# Patient Record
Sex: Male | Born: 1960 | Race: Black or African American | Hispanic: No | Marital: Single | Smoking: Never smoker
Health system: Southern US, Community
[De-identification: ages and names within clinical notes are randomized; demographics above are authoritative.]

## PROBLEM LIST (undated history)

## (undated) DIAGNOSIS — I10 Essential (primary) hypertension: Secondary | ICD-10-CM

---

## 2017-01-18 ENCOUNTER — Observation Stay (HOSPITAL_COMMUNITY)
Admission: EM | Admit: 2017-01-18 | Discharge: 2017-01-19 | Disposition: A | Discharge status: Home / Self Care | Payer: Self-pay | Attending: Internal Medicine | Admitting: Internal Medicine

## 2017-01-18 ENCOUNTER — Encounter (HOSPITAL_COMMUNITY): Payer: Self-pay

## 2017-01-18 ENCOUNTER — Emergency Department (HOSPITAL_COMMUNITY): Payer: Self-pay

## 2017-01-18 ENCOUNTER — Observation Stay (HOSPITAL_COMMUNITY): Payer: Self-pay

## 2017-01-18 DIAGNOSIS — I639 Cerebral infarction, unspecified: Principal | ICD-10-CM | POA: Diagnosis present

## 2017-01-18 DIAGNOSIS — R4781 Slurred speech: Secondary | ICD-10-CM | POA: Insufficient documentation

## 2017-01-18 DIAGNOSIS — I638 Other cerebral infarction: Secondary | ICD-10-CM

## 2017-01-18 DIAGNOSIS — E785 Hyperlipidemia, unspecified: Secondary | ICD-10-CM | POA: Insufficient documentation

## 2017-01-18 DIAGNOSIS — G459 Transient cerebral ischemic attack, unspecified: Secondary | ICD-10-CM

## 2017-01-18 DIAGNOSIS — R2681 Unsteadiness on feet: Secondary | ICD-10-CM | POA: Insufficient documentation

## 2017-01-18 DIAGNOSIS — R29898 Other symptoms and signs involving the musculoskeletal system: Secondary | ICD-10-CM | POA: Insufficient documentation

## 2017-01-18 DIAGNOSIS — I10 Essential (primary) hypertension: Secondary | ICD-10-CM | POA: Diagnosis present

## 2017-01-18 DIAGNOSIS — R29810 Facial weakness: Secondary | ICD-10-CM | POA: Insufficient documentation

## 2017-01-18 DIAGNOSIS — Z6831 Body mass index (BMI) 31.0-31.9, adult: Secondary | ICD-10-CM | POA: Insufficient documentation

## 2017-01-18 DIAGNOSIS — R270 Ataxia, unspecified: Secondary | ICD-10-CM | POA: Insufficient documentation

## 2017-01-18 DIAGNOSIS — F1490 Cocaine use, unspecified, uncomplicated: Secondary | ICD-10-CM | POA: Insufficient documentation

## 2017-01-18 DIAGNOSIS — F1410 Cocaine abuse, uncomplicated: Secondary | ICD-10-CM | POA: Insufficient documentation

## 2017-01-18 DIAGNOSIS — E669 Obesity, unspecified: Secondary | ICD-10-CM | POA: Insufficient documentation

## 2017-01-18 DIAGNOSIS — I6389 Other cerebral infarction: Secondary | ICD-10-CM

## 2017-01-18 DIAGNOSIS — Z7982 Long term (current) use of aspirin: Secondary | ICD-10-CM | POA: Insufficient documentation

## 2017-01-18 HISTORY — DX: Essential (primary) hypertension: I10

## 2017-01-18 LAB — I-STAT CHEM 8, ED
BUN: 12 mg/dL (ref 6–20)
CALCIUM ION: 1.28 mmol/L (ref 1.15–1.40)
CHLORIDE: 103 mmol/L (ref 101–111)
Creatinine, Ser: 1.1 mg/dL (ref 0.61–1.24)
GLUCOSE: 116 mg/dL — AB (ref 65–99)
HCT: 40 % (ref 39.0–52.0)
HEMOGLOBIN: 13.6 g/dL (ref 13.0–17.0)
Potassium: 3.9 mmol/L (ref 3.5–5.1)
Sodium: 140 mmol/L (ref 135–145)
TCO2: 27 mmol/L (ref 0–100)

## 2017-01-18 LAB — URINALYSIS, ROUTINE W REFLEX MICROSCOPIC
BACTERIA UA: NONE SEEN
BILIRUBIN URINE: NEGATIVE
GLUCOSE, UA: NEGATIVE mg/dL
KETONES UR: NEGATIVE mg/dL
LEUKOCYTES UA: NEGATIVE
NITRITE: NEGATIVE
PH: 7 (ref 5.0–8.0)
Protein, ur: NEGATIVE mg/dL
Specific Gravity, Urine: 1.03 (ref 1.005–1.030)
Squamous Epithelial / LPF: NONE SEEN

## 2017-01-18 LAB — COMPREHENSIVE METABOLIC PANEL
ALK PHOS: 50 U/L (ref 38–126)
ALT: 18 U/L (ref 17–63)
ANION GAP: 6 (ref 5–15)
AST: 15 U/L (ref 15–41)
Albumin: 4.2 g/dL (ref 3.5–5.0)
BILIRUBIN TOTAL: 0.8 mg/dL (ref 0.3–1.2)
BUN: 9 mg/dL (ref 6–20)
CALCIUM: 9.7 mg/dL (ref 8.9–10.3)
CO2: 27 mmol/L (ref 22–32)
Chloride: 105 mmol/L (ref 101–111)
Creatinine, Ser: 0.96 mg/dL (ref 0.61–1.24)
Glucose, Bld: 118 mg/dL — ABNORMAL HIGH (ref 65–99)
Potassium: 3.9 mmol/L (ref 3.5–5.1)
Sodium: 138 mmol/L (ref 135–145)
TOTAL PROTEIN: 6.8 g/dL (ref 6.5–8.1)

## 2017-01-18 LAB — I-STAT TROPONIN, ED: TROPONIN I, POC: 0 ng/mL (ref 0.00–0.08)

## 2017-01-18 LAB — DIFFERENTIAL
Basophils Absolute: 0.1 10*3/uL (ref 0.0–0.1)
Basophils Relative: 1 %
EOS PCT: 1 %
Eosinophils Absolute: 0.1 10*3/uL (ref 0.0–0.7)
Lymphocytes Relative: 34 %
Lymphs Abs: 2.4 10*3/uL (ref 0.7–4.0)
MONO ABS: 0.5 10*3/uL (ref 0.1–1.0)
MONOS PCT: 7 %
Neutro Abs: 4.1 10*3/uL (ref 1.7–7.7)
Neutrophils Relative %: 57 %

## 2017-01-18 LAB — CBC
HEMATOCRIT: 38.4 % — AB (ref 39.0–52.0)
Hemoglobin: 13 g/dL (ref 13.0–17.0)
MCH: 30.7 pg (ref 26.0–34.0)
MCHC: 33.9 g/dL (ref 30.0–36.0)
MCV: 90.8 fL (ref 78.0–100.0)
Platelets: 335 10*3/uL (ref 150–400)
RBC: 4.23 MIL/uL (ref 4.22–5.81)
RDW: 14.4 % (ref 11.5–15.5)
WBC: 7.1 10*3/uL (ref 4.0–10.5)

## 2017-01-18 LAB — PROTIME-INR
INR: 1.02
PROTHROMBIN TIME: 13.4 s (ref 11.4–15.2)

## 2017-01-18 LAB — RAPID URINE DRUG SCREEN, HOSP PERFORMED
Amphetamines: NOT DETECTED
Barbiturates: NOT DETECTED
Benzodiazepines: NOT DETECTED
Cocaine: POSITIVE — AB
Opiates: NOT DETECTED
Tetrahydrocannabinol: NOT DETECTED

## 2017-01-18 LAB — APTT: aPTT: 31 seconds (ref 24–36)

## 2017-01-18 MED ORDER — ACETAMINOPHEN 650 MG RE SUPP: 650.0000 mg | RECTAL | Status: DC | PRN

## 2017-01-18 MED ORDER — STROKE: EARLY STAGES OF RECOVERY BOOK
Freq: Once | Status: AC
  Administered 2017-01-18
  Filled 2017-01-18: qty 1

## 2017-01-18 MED ORDER — ASPIRIN 300 MG RE SUPP: 300.0000 mg | Freq: Every day | RECTAL | Status: DC

## 2017-01-18 MED ORDER — SODIUM CHLORIDE 0.9 % IV SOLN
INTRAVENOUS | Status: DC
  Administered 2017-01-18: via INTRAVENOUS

## 2017-01-18 MED ORDER — ASPIRIN 325 MG PO TABS
325.0000 mg | ORAL_TABLET | Freq: Every day | ORAL | Status: DC
  Administered 2017-01-19: 325 mg via ORAL
  Filled 2017-01-18: qty 1

## 2017-01-18 MED ORDER — ACETAMINOPHEN 325 MG PO TABS: 650.0000 mg | ORAL_TABLET | ORAL | Status: DC | PRN

## 2017-01-18 MED ORDER — IOPAMIDOL (ISOVUE-370) INJECTION 76%
INTRAVENOUS | Status: AC
  Administered 2017-01-18: 50 mL
  Filled 2017-01-18: qty 50

## 2017-01-18 MED ORDER — ACETAMINOPHEN 160 MG/5ML PO SOLN: 650.0000 mg | ORAL | Status: DC | PRN

## 2017-01-18 NOTE — ED Provider Notes (Signed)
[facility]-EMERGENCY DEPT Provider Note   CSN: [id number] Arrival date & time: 01/18/17  1713     History   Chief Complaint Chief Complaint  Patient presents with  . Aphasia  . Numbness    HPI Craig Atkinson is a 56 y.o. male.  HPI   Patient is a 56 year old male with history of hypertension who presents the ED with complaint of left-sided facial numbness. Patient reports initially waking up yesterday morning around 8:30 and having mild lightheadedness upon standing up from bed. He states the lightheadedness resolved but notes mid day yesterday he began noticing numbness to the left side of his face near his cheek and jaw. Patient states the numbness has remained constant since yesterday afternoon. He notes yesterday evening when he was talking to a friend on the phone he reported noticing that his speech seemed slightly slurred. Patient states he thinks it was due to laying down while being on the phone and notes his friend reported to speech has mildly improved upon standing. He also reports having mild weakness to his left arm that started yesterday afternoon with onset of numbness. Patient describes weakness as "my left arm just feels lazy". Denies fever, headache, visual changes, dizziness, facial swelling, facial weakness, confusion, altered mental status, shortness of breath, chest pain, palpitations, abdominal pain, vomiting, sig B, seizure. Patient denies history of strokes. Denies use of anticoagulants.   Past Medical History:  Diagnosis Date  . Hypertension     Patient Active Problem List   Diagnosis Date Noted  . CVA (cerebral vascular accident) (HCC) 01/18/2017  . Essential hypertension 01/18/2017    History reviewed. No pertinent surgical history.     Home Medications    Prior to Admission medications   Medication Sig Start Date End Date Taking? Authorizing Provider  lisinopril (PRINIVIL,ZESTRIL) 10 MG tablet Take 10 mg by mouth daily.   Yes Historical Provider, MD     Family History History reviewed. No pertinent family history.  Social History Social History  Substance Use Topics  . Smoking status: Never Smoker  . Smokeless tobacco: Never Used  . Alcohol use Yes     Allergies   Patient has no known allergies.   Review of Systems Review of Systems  Neurological: Positive for numbness.  All other systems reviewed and are negative.    Physical Exam Updated Vital Signs BP (!) 148/79   Pulse 67   Temp 98.1 F (36.7 C)   SpO2 99%   Physical Exam  Constitutional: He is oriented to person, place, and time. He appears well-developed and well-nourished.  HENT:  Head: Normocephalic and atraumatic.  Mouth/Throat: Uvula is midline, oropharynx is clear and moist and mucous membranes are normal. No oropharyngeal exudate, posterior oropharyngeal edema, posterior oropharyngeal erythema or tonsillar abscesses. No tonsillar exudate.  Eyes: Conjunctivae and EOM are normal. Pupils are equal, round, and reactive to light. Right eye exhibits no discharge. Left eye exhibits no discharge. No scleral icterus.  Neck: Normal range of motion. Neck supple.  Cardiovascular: Normal rate, regular rhythm, normal heart sounds and intact distal pulses.   Pulmonary/Chest: Effort normal and breath sounds normal. No respiratory distress. He has no wheezes. He has no rales. He exhibits no tenderness.  Abdominal: Soft. Bowel sounds are normal. He exhibits no distension and no mass. There is no tenderness. There is no rebound and no guarding. No hernia.  Musculoskeletal: Normal range of motion. He exhibits no edema or tenderness.  No midline C, T, or L tenderness. Full  range of motion of neck and back. Full range of motion of bilateral upper and lower extremities, with 5/5 strength. Sensation intact. 2+ radial and PT pulses. Cap refill <2 seconds.   Lymphadenopathy:    He has no cervical adenopathy.  Neurological: He is alert and oriented to person, place, and time. He  has normal strength and normal reflexes. A sensory deficit is present. No cranial nerve deficit. He displays a negative Romberg sign. Coordination and gait normal.  Sensation grossly intact but patient reports decreased sensation to left side of face over maxillary and mandibular region.  Skin: Skin is warm and dry.  Nursing note and vitals reviewed.    ED Treatments / Results  Labs (all labs ordered are listed, but only abnormal results are displayed) Labs Reviewed  CBC - Abnormal; Notable for the following:       Result Value   HCT 38.4 (*)    All other components within normal limits  COMPREHENSIVE METABOLIC PANEL - Abnormal; Notable for the following:    Glucose, Bld 118 (*)    All other components within normal limits  I-STAT CHEM 8, ED - Abnormal; Notable for the following:    Glucose, Bld 116 (*)    All other components within normal limits  PROTIME-INR  APTT  DIFFERENTIAL  I-STAT TROPOININ, ED  CBG MONITORING, ED    EKG  EKG Interpretation None       Radiology Ct Head Wo Contrast  Result Date: 01/18/2017 CLINICAL DATA:  56 year old male with dizziness and slurred speech and numbness on the left side of the face. EXAM: CT HEAD WITHOUT CONTRAST TECHNIQUE: Contiguous axial images were obtained from the base of the skull through the vertex without intravenous contrast. COMPARISON:  None FINDINGS: Brain: There is a 1.0 x 1.2 cm hypoattenuating area in the right thalamus most consistent with an ischemic infarct, likely acute or early subacute. Correlation with clinical exam and further evaluation with MRI recommended. There is no acute intracranial hemorrhage. No mass effect or midline shift noted. The ventricles and sulci are appropriate in size for patient's age. Vascular: No hyperdense vessel or unexpected calcification. Skull: Normal. Negative for fracture or focal lesion. Sinuses/Orbits: The visualized paranasal sinuses are clear. There is partial opacification of the  left mastoid air cells. The right mastoid air cells are clear. Other: None IMPRESSION: 1. Right thalamic ischemic infarct, likely acute or subacute. Correlation with clinical exam and further evaluation with MRI recommended. 2. No acute intracranial hemorrhage. These results were called by telephone at the time of interpretation on 01/18/2017 at 6:47 pm to Dr. Jacqulyn Bath, who verbally acknowledged these results. Electronically Signed   By: Elgie Collard M.D.   On: 01/18/2017 19:15    Procedures Procedures (including critical care time)  Medications Ordered in ED Medications - No data to display   Initial Impression / Assessment and Plan / ED Course  I have reviewed the triage vital signs and the nursing notes.  Pertinent labs & imaging results that were available during my care of the patient were reviewed by me and considered in my medical decision making (see chart for details).     Patient presents with left-sided facial numbness that started yesterday afternoon with associated mild left arm weakness. Symptoms have remained constant today. History of hypertension. VSS. Exam revealed decreased sensation to maxillary and mandibular region of left face, remaining neuro exam unremarkable. CT head ordered in triage revealed right thalamic ischemic infarct. Discussed results and plan for admission with  pt. Consulted neurology, Dr. Amada JupiterKirkpatrick reports he will come evaluate pt in the ED, plan to admit to medicine. Consulted hospitalist. Dr. Adela Glimpseoutova agrees to admission.  Final Clinical Impressions(s) / ED Diagnoses   Final diagnoses:  Cerebrovascular accident (CVA) due to other mechanism [facility](HCC)    New Prescriptions New Prescriptions   No medications on file     Barrett Henleicole Elizabeth Alexsis Branscom, Cordelia Poche-C 01/18/17 2117    Marily MemosJason Mesner, MD 01/18/17 2351

## 2017-01-18 NOTE — H&P (Signed)
Craig Atkinson ZOX:[id number] DOB: October 21, 1961 DOA: 01/18/2017     PCP: No primary care provider on file.   Outpatient Specialists: none   Patient coming from:  home Lives alone,       Chief Complaint: L facial numbness slurred speech  HPI: Craig Atkinson is a 56 y.o. male with medical history significant of HTN    Presented with    weakness of the left side of his face since yesterday associated with some dizziness and some headache,  Yesterday started at 8:30 AM  Associated with lightheadness worse with standing. His numbness have persistent since yesterday. He was talking to friend and noted slurred speech. Reports sometimes he forgets his medication, yesterday had some weakness in left leg but now better only jaw numbness have persisted. He was able to ambulate.   NO fever, chills, no chest pain Regarding pertinent Chronic problems: hypertension   IN ER:  Temp (24hrs), Avg:98.1 F (36.7 C), Min:98.1 F (36.7 C), Max:98.1 F (36.7 C)  CT showing acute  right thalamic infarct Following Medications were ordered in ER: Medications - No data to display   ER provider discussed case with: Neurology , left facial numbness  Hospitalist was called for admission for CVA  Review of Systems:    Pertinent positives include: slurred speech  Constitutional:  No weight loss, night sweats, Fevers, chills, fatigue, weight loss  HEENT:  No headaches, Difficulty swallowing,Tooth/dental problems,Sore throat,  No sneezing, itching, ear ache, nasal congestion, post nasal drip,  Cardio-vascular:  No chest pain, Orthopnea, PND, anasarca, dizziness, palpitations.no Bilateral lower extremity swelling  GI:  No heartburn, indigestion, abdominal pain, nausea, vomiting, diarrhea, change in bowel habits, loss of appetite, melena, blood in stool, hematemesis Resp:  no shortness of breath at rest. No dyspnea on exertion, No excess mucus, no productive cough, No non-productive cough, No  coughing up of blood.No change in color of mucus.No wheezing. Skin:  no rash or lesions. No jaundice GU:  no dysuria, change in color of urine, no urgency or frequency. No straining to urinate.  No flank pain.  Musculoskeletal:  No joint pain or no joint swelling. No decreased range of motion. No back pain.  Psych:  No change in mood or affect. No depression or anxiety. No memory loss.  Neuro: no localizing neurological complaints,   no weakness, no double vision, no gait abnormality,    As per HPI otherwise 10 point review of systems negative.   Past Medical History: Past Medical History:  Diagnosis Date  . Hypertension    History reviewed. No pertinent surgical history.   Social History:  Ambulatory  Independently     reports that he has never smoked. He has never used smokeless tobacco. He reports that he drinks alcohol. He reports that he uses drugs, including Cocaine.  Allergies:  No Known Allergies     Family History:   Family History  Problem Relation Age of Onset  . Diabetes Mother   . Hypertension Mother   . Diabetes Father   . Hypertension Father   . Melanoma Father   . Stroke Neg Hx     Medications: Prior to Admission medications   Medication Sig Start Date End Date Taking? Authorizing Provider  lisinopril (PRINIVIL,ZESTRIL) 10 MG tablet Take 10 mg by mouth daily.   Yes Historical Provider, MD    Physical Exam: Patient Vitals for the past 24 hrs:  BP Temp Pulse SpO2  01/18/17 1935 (!) 148/79 - 67 99 %  01/18/17 1722 (!) 144/77 98.1 F (36.7 C) 70 100 %    1. General:  in No Acute distress 2. Psychological: Alert and  Oriented 3. Head/ENT:     Dry Mucous Membranes                          Head Non traumatic, neck supple                          Normal   Dentition 4. SKIN: normal  Skin turgor,  Skin clean Dry and intact no rash 5. Heart: Regular rate and rhythm no  Murmur, Rub or gallop 6. Lungs:  no wheezes or crackles   7. Abdomen: Soft,  non-tender, Non distended 8. Lower extremities: no clubbing, cyanosis, or edema 9. Neurologically   strength 5 out of 5 in all 4 extremities cranial nerves left facial droop 10. MSK: Normal range of motion   body mass index is unknown because there is no height or weight on file.  Labs on Admission:   Labs on Admission: I have personally reviewed following labs and imaging studies  CBC:  Recent Labs Lab 01/18/17 1725 01/18/17 1747  WBC 7.1  --   NEUTROABS 4.1  --   HGB 13.0 13.6  HCT 38.4* 40.0  MCV 90.8  --   PLT 335  --    Basic Metabolic Panel:  Recent Labs Lab 01/18/17 1725 01/18/17 1747  NA 138 140  K 3.9 3.9  CL 105 103  CO2 27  --   GLUCOSE 118* 116*  BUN 9 12  CREATININE 0.96 1.10  CALCIUM 9.7  --    GFR: CrCl cannot be calculated (Unknown ideal weight.). Liver Function Tests:  Recent Labs Lab 01/18/17 1725  AST 15  ALT 18  ALKPHOS 50  BILITOT 0.8  PROT 6.8  ALBUMIN 4.2   No results for input(s): LIPASE, AMYLASE in the last 168 hours. No results for input(s): AMMONIA in the last 168 hours. Coagulation Profile:  Recent Labs Lab 01/18/17 1725  INR 1.02   Cardiac Enzymes: No results for input(s): CKTOTAL, CKMB, CKMBINDEX, TROPONINI in the last 168 hours. BNP (last 3 results) No results for input(s): PROBNP in the last 8760 hours. HbA1C: No results for input(s): HGBA1C in the last 72 hours. CBG: No results for input(s): GLUCAP in the last 168 hours. Lipid Profile: No results for input(s): CHOL, HDL, LDLCALC, TRIG, CHOLHDL, LDLDIRECT in the last 72 hours. Thyroid Function Tests: No results for input(s): TSH, T4TOTAL, FREET4, T3FREE, THYROIDAB in the last 72 hours. Anemia Panel: No results for input(s): VITAMINB12, FOLATE, FERRITIN, TIBC, IRON, RETICCTPCT in the last 72 hours. Urine analysis: No results found for: COLORURINE, APPEARANCEUR, LABSPEC, PHURINE, GLUCOSEU, HGBUR, BILIRUBINUR, KETONESUR, PROTEINUR, UROBILINOGEN, NITRITE,  LEUKOCYTESUR Sepsis Labs: @LABRCNTIP (procalcitonin:4,lacticidven:4) )No results found for this or any previous visit (from the past 240 hour(s)).     UA ordered  No results found for: HGBA1C  CrCl cannot be calculated (Unknown ideal weight.).  BNP (last 3 results) No results for input(s): PROBNP in the last 8760 hours.   ECG REPORT  Independently reviewed Rate: 69  Rhythm: NSR ST&T Change: No acute ischemic changes   QTC 409  There were no vitals filed for this visit.   Cultures: No results found for: SDES, SPECREQUEST, CULT, REPTSTATUS   Radiological Exams on Admission: Ct Head Wo Contrast  Result Date: 01/18/2017 CLINICAL DATA:  [age] year old  male with dizziness and slurred speech and numbness on the left side of the face. EXAM: CT HEAD WITHOUT CONTRAST TECHNIQUE: Contiguous axial images were obtained from the base of the skull through the vertex without intravenous contrast. COMPARISON:  None FINDINGS: Brain: There is a 1.0 x 1.2 cm hypoattenuating area in the right thalamus most consistent with an ischemic infarct, likely acute or early subacute. Correlation with clinical exam and further evaluation with MRI recommended. There is no acute intracranial hemorrhage. No mass effect or midline shift noted. The ventricles and sulci are appropriate in size for patient's age. Vascular: No hyperdense vessel or unexpected calcification. Skull: Normal. Negative for fracture or focal lesion. Sinuses/Orbits: The visualized paranasal sinuses are clear. There is partial opacification of the left mastoid air cells. The right mastoid air cells are clear. Other: None IMPRESSION: 1. Right thalamic ischemic infarct, likely acute or subacute. Correlation with clinical exam and further evaluation with MRI recommended. 2. No acute intracranial hemorrhage. These results were called by telephone at the time of interpretation on 01/18/2017 at 6:47 pm to Dr. Jacqulyn Bath, who verbally acknowledged these results.  Electronically Signed   By: Elgie Collard M.D.   On: 01/18/2017 19:15    Chart has been reviewed    Assessment/Plan  56 y.o. male with medical history significant of HTN admitted for CVA   Present on Admission: . CVA (cerebral vascular accident) (HCC) -  - will admit based on TIA/CVA protocol, await results of  Carotid Doppler and Echo, obtain cardiac enzymes,  ECG,   Lipid panel, TSH. Order PT/OT evaluation. Will make sure patient is on antiplatelet agent.   Neurology consulted.      . Essential hypertension hold home medications for tonight to allow some permissive hypertension   Other plan as per orders.  DVT prophylaxis:  SCD    Code Status:  FULL CODE  as per patient    Family Communication:   Family not  at  Bedside    Disposition Plan:     To home once workup is complete and patient is stable                     Would benefit from PT/OT eval prior to DC  ordered                              Consults called: Neurology aware    Admission status:    obs    Level of care    tele            I have spent a total of 56 min on this admission   extra time was spent to discuss case with neurology  Tyrie Porzio 01/18/2017, 11:40 PM    [facility]  Pager [phone]   after 2 AM please page floor coverage PA If 7AM-7PM, please contact the day team taking care of the patient  [email or web address]  Password [email or web address]

## 2017-01-18 NOTE — ED Provider Notes (Signed)
Called by radiology regarding CT head ordered for patient still in the waiting room. There is a acute vs early sub acute right thalamic infarct on CT. Will work to get patient back to an acute bed ASAP.   Alona BeneJoshua Candela Krul, MD   Maia PlanJoshua G Jyllian Haynie, MD 01/18/17 [phone]

## 2017-01-18 NOTE — ED Notes (Signed)
Report called to rn on 5c rm 13

## 2017-01-18 NOTE — ED Notes (Signed)
The pt is c/o dizziness and numbness on the lt side of his face   Since yesterday  No pain no speech problem

## 2017-01-18 NOTE — Progress Notes (Signed)
Pt admitted from ED with stroke diagnosis, alert and oriented, denies any pain, pt settled in bed with call light at bedside, tele monitor put and verified on pt, pt reassured and will continue to monitor, v/s stable. Obasogie-Asidi, Rocko Fesperman Efe

## 2017-01-18 NOTE — Consult Note (Signed)
Neurology Consultation Reason for Consult: Left facial numbness Referring Physician: Mesner, J  CC: Left facial numbness  History is obtained from:patient  HPI: Craig Atkinson is a 56 y.o. male who awoke the morning of 3/15 with numbness on the left side of his face and mild difficult walking. He states that he also noticed some mild slurred speech when talking to a friend. This persisted throughout the day, and was still ongoing today and therefore he sought care in the emergency department this afternoon. Here, a CT scan was performed which demonstrates right thalamic infarct. He last used cocaine 5 days ago.   LKW: 3/14, prior to bed tpa given?: no, out of window   ROS: A 14 point ROS was performed and is negative except as noted in the HPI.   Past Medical History:  Diagnosis Date  . Hypertension      Family history: No history of similar   Social History:  reports that he has never smoked. He has never used smokeless tobacco. He reports that he drinks alcohol. He reports that he uses drugs, including Cocaine.   Exam: Current vital signs: BP (!) 148/79   Pulse 67   Temp 98.1 F (36.7 C)   SpO2 99%  Vital signs in last 24 hours: Temp:  [98.1 F (36.7 C)] 98.1 F (36.7 C) (03/16 1722) Pulse Rate:  [67-70] 67 (03/16 1935) BP: (144-148)/(77-79) 148/79 (03/16 1935) SpO2:  [99 %-100 %] 99 % (03/16 1935)   Physical Exam  Constitutional: Appears well-developed and well-nourished.  Psych: Affect appropriate to situation Eyes: No scleral injection HENT: No OP obstrucion Head: Normocephalic.  Cardiovascular: Normal rate and regular rhythm.  Respiratory: Effort normal and breath sounds normal to anterior ascultation GI: Soft.  No distension. There is no tenderness.  Skin: WDI  Neuro: Mental Status: Patient is awake, alert, oriented to person, place, month, year, and situation. Patient is able to give a clear and coherent history. No signs of aphasia or  neglect Cranial Nerves: II: Visual Fields are full. Pupils are equal, round, and reactive to light.   III,IV, VI: EOMI without ptosis or diploplia.  V: Facial sensation is symmetric to temperature VII: Facial movement is symmetric.  VIII: hearing is intact to voice X: Uvula elevates symmetrically XI: Shoulder shrug is symmetric. XII: tongue is midline without atrophy or fasciculations.  Motor: Tone is normal. Bulk is normal. 5/5 strength was present in all four extremities.  Sensory: Sensation is symmetric to light touch and temperature in the arms and legs. Deep Tendon Reflexes: 2+ and symmetric in the biceps and 3+ in patellae.  Plantars: Toes are downgoing bilaterally.  Cerebellar: He has difficulty with finger-nose-finger and heel-knee-shin on the left compared to the right.   I have reviewed labs in epic and the results pertinent to this consultation are: CMP-unremarkable  I have reviewed the images obtained: CT head-right thalamic stroke  Impression: 56 year old male with likely small vessel infarction. Certainly cocaine could have contributed. He'll need admission and workup for other risk factors that are modifiable. I have counseled him to stop cocaine use.  Recommendations: 1. HgbA1c, fasting lipid panel 2. MRI of the brain without contrast 3. Frequent neuro checks 4. Echocardiogram 5. CTA head and neck 6. Prophylactic therapy-Antiplatelet med: Aspirin - dose 325mg  PO or 300mg  PR 7. Risk factor modification 8. Telemetry monitoring 9. PT consult, OT consult, Speech consult 10. please page stroke NP  Or  PA  Or MD  from 8am -4 pm starting  3/17 as this patient will be followed by the stroke team at this point.   You can look them up on [email or web address]      Ritta Slot, MD [facility] [phone]  If 7pm- 7am, please page neurology on call as listed in AMION.

## 2017-01-18 NOTE — ED Notes (Signed)
Dr. Clayborne DanaMesner requested pt be fed.

## 2017-01-18 NOTE — ED Notes (Signed)
The pt was not npo prior to the swallow screen   Dr Clayborne Danamesner edp gave the pt permission to eat before the swallow screen was performed.  He ate a [place] sandwich  Given to him by nt

## 2017-01-18 NOTE — ED Notes (Signed)
Equal grips moves all fours   Speech clear

## 2017-01-18 NOTE — ED Notes (Signed)
Ed pa gave the pt a nih of 1 for los of sensation lt face

## 2017-01-18 NOTE — ED Notes (Signed)
Pt returned from ct

## 2017-01-18 NOTE — ED Notes (Signed)
Per ed pa

## 2017-01-18 NOTE — ED Triage Notes (Signed)
Pt complaining of L sided facial numbness and slurred speech. Pt states LSN yesterday. Pt a/o x 4 at triage, no other obvious neuro deficits. Pt denies any headache, states some dizziness yesterday.

## 2017-01-19 ENCOUNTER — Observation Stay (HOSPITAL_COMMUNITY): Payer: Self-pay

## 2017-01-19 ENCOUNTER — Observation Stay (HOSPITAL_BASED_OUTPATIENT_CLINIC_OR_DEPARTMENT_OTHER): Payer: Self-pay

## 2017-01-19 DIAGNOSIS — I6789 Other cerebrovascular disease: Secondary | ICD-10-CM

## 2017-01-19 DIAGNOSIS — F1410 Cocaine abuse, uncomplicated: Secondary | ICD-10-CM

## 2017-01-19 LAB — ECHOCARDIOGRAM COMPLETE
HEIGHTINCHES: 62 in
Weight: 2761.6 oz

## 2017-01-19 LAB — LIPID PANEL
CHOL/HDL RATIO: 5.4 ratio
Cholesterol: 212 mg/dL — ABNORMAL HIGH (ref 0–200)
HDL: 39 mg/dL — ABNORMAL LOW (ref >40)
LDL CALC: 144 mg/dL — AB (ref 0–99)
TRIGLYCERIDES: 147 mg/dL (ref <150)
VLDL: 29 mg/dL (ref 0–40)

## 2017-01-19 MED ORDER — NONFORMULARY OR COMPOUNDED ITEM: 0 refills | Status: AC

## 2017-01-19 MED ORDER — ATORVASTATIN CALCIUM 40 MG PO TABS: 40.0000 mg | ORAL_TABLET | Freq: Every day | ORAL | Status: DC

## 2017-01-19 MED ORDER — ATORVASTATIN CALCIUM 40 MG PO TABS: 40.0000 mg | ORAL_TABLET | Freq: Every day | ORAL | 0 refills | Status: DC

## 2017-01-19 MED ORDER — ASPIRIN 325 MG PO TABS: 325.0000 mg | ORAL_TABLET | Freq: Every day | ORAL | 0 refills | Status: DC

## 2017-01-19 NOTE — Discharge Summary (Signed)
PATIENT DETAILS Name: Craig Atkinson Age: 56 y.o. Sex: male Date of Birth: 05-13-1961 MRN: [id number]. Admitting Physician: Therisa Doyne, MD PCP:No PCP Per Patient  Admit Date: 01/18/2017 Discharge date: 01/19/2017  Recommendations for Outpatient Follow-up:  1. Follow up with PCP in 1-2 weeks 2. Please obtain BMP/CBC in one week 3. Follow A1C and HIV-pending at the time of discharge.   Admitted From:  Home  Disposition: Home  Home Health: No  Equipment/Devices: None  Discharge Condition: Stable  CODE STATUS: FULL CODE  Diet recommendation:  Heart Healthy   Brief Summary: See H&P, Labs, Consult and Test reports for all details in brief, Patient is a 56 y.o. male with past medical history of hypertension, cocaine use-presented with left facial numbness, slight slurred speech and left upper extremity weakness-CT scan of the head demonstrated a right thalamic infarct. His last cocaine use was approximately 5 days back. 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 Very minimal left upper extremity weakness.MRI brain confirmed a 1.4 cm acute infarction int he right thalamus. Echo showed EF 55% with no embolic source. .CTA neck without any major stenosis. Telemetry-negative for arrhythmias. LDL 144. Patient now on ASA and Lipitor. Spoke with Stroke MD-Dr Desmond Dike further recommendation-ok to discharge today.As noted above-A1C pending, will need to be followed by PCP.  Hypertension: BP stable-resume anti-hypertensive on discharge.   Cocaine abuse: Counseled extensively-he is aware of the life threatening and life disabling risk associated with cocaine use. Note at patient's request (see prior note) this was NOT discussed with his family.  Procedures/Studies: Echo 3/17>> - Left ventricle: The cavity size was normal. There was mild focal   basal hypertrophy of the septum. Systolic  function was normal.   The estimated ejection fraction was in the range of 55% to 60%.   Wall motion was normal; there were no regional wall motion   abnormalities. Left ventricular diastolic function parameters   were normal. - Mitral valve: Focal calcification. There was trivial   regurgitation. - Left atrium: The atrium was mildly dilated. - Pulmonic valve: There was trivial regurgitation.   Discharge Diagnoses:  Active Problems:   CVA (cerebral vascular accident) [facility])   Essential hypertension   Discharge Instructions:  Activity:  As tolerated    Discharge Instructions    Ambulatory referral to Neurology    Complete by:  As directed    Call MD for:  hives    Complete by:  As directed    Call MD for:  persistant dizziness or light-headedness    Complete by:  As directed    Call MD for:  redness, tenderness, or signs of infection (pain, swelling, redness, odor or green/yellow discharge around incision site)    Complete by:  As directed    Diet - low sodium heart healthy    Complete by:  As directed    Discharge instructions    Complete by:  As directed    Follow with Primary MD in 1 week  Your A1C and HIV test are pending at the time of discharge, please ask your primary MD to follow these test results  Please get a complete blood count and chemistry panel checked by your Primary MD at your next visit, and again as instructed by your Primary MD.  Get Medicines reviewed and adjusted: Please take all your medications with you for your next visit with your Primary MD  Laboratory/radiological data: Please request your  Primary MD to go over all hospital tests and procedure/radiological results at the follow up, please ask your Primary MD to get all Hospital records sent to his/her office.  In some cases, they will be blood work, cultures and biopsy results pending at the time of your discharge. Please request that your primary care M.D. follows up on these  results.  Also Note the following: If you experience worsening of your admission symptoms, develop shortness of breath, life threatening emergency, suicidal or homicidal thoughts you must seek medical attention immediately by calling 911 or calling your MD immediately  if symptoms less severe.  You must read complete instructions/literature along with all the possible adverse reactions/side effects for all the Medicines you take and that have been prescribed to you. Take any new Medicines after you have completely understood and accpet all the possible adverse reactions/side effects.   Do not drive when taking Pain medications or sleeping medications (Benzodaizepines)  Do not take more than prescribed Pain, Sleep and Anxiety Medications. It is not advisable to combine anxiety,sleep and pain medications without talking with your primary care practitioner  Special Instructions: If you have smoked or chewed Tobacco  in the last 2 yrs please stop smoking, stop any regular Alcohol  and or any Recreational drug use.  Wear Seat belts while driving.  Please note: You were cared for by a hospitalist during your hospital stay. Once you are discharged, your primary care physician will handle any further medical issues. Please note that NO REFILLS for any discharge medications will be authorized once you are discharged, as it is imperative that you return to your primary care physician (or establish a relationship with a primary care physician if you do not have one) for your post hospital discharge needs so that they can reassess your need for medications and monitor your lab values.   Increase activity slowly    Complete by:  As directed      Allergies as of 01/19/2017   No Known Allergies     Medication List    TAKE these medications   aspirin 325 MG tablet Take 1 tablet (325 mg total) by mouth daily. Start taking on:  01/20/2017   atorvastatin 40 MG tablet Commonly known as:  LIPITOR Take 1  tablet (40 mg total) by mouth daily at 6 PM.   lisinopril 10 MG tablet Commonly known as:  PRINIVIL,ZESTRIL Take 10 mg by mouth daily.   NONFORMULARY OR COMPOUNDED ITEM Please evaluate and treat for outpatient OT Diagnoses: Acute CVA      Follow-up Information    [facility]. Schedule an appointment as soon as possible for a visit in 1 week(s).   Why:  please call on 3/19-and make a appointment. Please let them know that you were admitted to the hospital. Contact information: [address] [place] [address] [phone]         No Known Allergies  Consultations:   neurology  Other Procedures/Studies: Ct Angio Head W Or Wo Contrast  Result Date: 01/18/2017 CLINICAL DATA:  Left-sided facial numbness EXAM: CT ANGIOGRAPHY HEAD AND NECK TECHNIQUE: Multidetector CT imaging of the head and neck was performed using the standard protocol during bolus administration of intravenous contrast. Multiplanar CT image reconstructions and MIPs were obtained to evaluate the vascular anatomy. Carotid stenosis measurements (when applicable) are obtained utilizing NASCET criteria, using the distal internal carotid diameter as the denominator. CONTRAST:  50 mL Isovue 370 COMPARISON:  Head  CT 01/18/2017 FINDINGS: CTA NECK FINDINGS Aortic arch: There is no aneurysm or dissection of the visualized ascending aorta or aortic arch. There is a normal 3 vessel branching pattern. The visualized proximal subclavian arteries are normal. Right carotid system: There is mixed calcified and noncalcified atherosclerotic plaque within the proximal right internal carotid artery without hemodynamically significant stenosis by NASCET criteria. The remainder of the right ICA is normal. Left carotid system: There is mixed atherosclerotic calcified and noncalcified plaque within the proximal left internal carotid artery without hemodynamically significant stenosis. Otherwise  normal. Vertebral arteries: The vertebral system is left dominant. Both vertebral artery origins are normal. Both vertebral arteries are normal to their confluence with the basilar artery. Skeleton: There is no bony spinal canal stenosis. No lytic or blastic lesions. Other neck: The nasopharynx is clear. The oropharynx and hypopharynx are normal. The epiglottis is normal. The supraglottic larynx, glottis and subglottic larynx are normal. No retropharyngeal collection. The parapharyngeal spaces are preserved. The parotid and submandibular glands are normal. No sialolithiasis or salivary ductal dilatation. The thyroid gland is normal. There is no cervical lymphadenopathy. Upper chest: No pneumothorax or pleural effusion. No nodules or masses. Review of the MIP images confirms the above findings CTA HEAD FINDINGS Anterior circulation: --Intracranial internal carotid arteries: There is atherosclerotic calcification of both clinoid segments with a small amount of noncalcified plaque within the right distal clinoid/proximal communicating segment. --Anterior cerebral arteries: Normal. --Middle cerebral arteries: Normal. --Posterior communicating arteries: There is a small P-comm on the left. Posterior circulation: --Posterior cerebral arteries: Normal. --Superior cerebellar arteries: Normal. --Basilar artery: Normal. --Anterior inferior cerebellar arteries: Normal. --Posterior inferior cerebellar arteries: Normal. Venous sinuses: As permitted by contrast timing, patent. Anatomic variants: None Delayed phase: No parenchymal contrast enhancement. Review of the MIP images confirms the above findings IMPRESSION: 1. No emergent large vessel occlusion. 2. Bilateral mixed calcified and noncalcified atherosclerotic plaque within the proximal internal carotid arteries without hemodynamically significant stenosis by NASCET criteria. 3. Bilateral calcific atherosclerosis of the supraclinoid internal carotid arteries with a small  amount of noncalcified plaque in the communicating segment of the right ICA. Electronically Signed   By: Deatra Robinson M.D.   On: 01/18/2017 22:58   Ct Head Wo Contrast  Result Date: 01/18/2017 CLINICAL DATA:  56 year old male with dizziness and slurred speech and numbness on the left side of the face. EXAM: CT HEAD WITHOUT CONTRAST TECHNIQUE: Contiguous axial images were obtained from the base of the skull through the vertex without intravenous contrast. COMPARISON:  None FINDINGS: Brain: There is a 1.0 x 1.2 cm hypoattenuating area in the right thalamus most consistent with an ischemic infarct, likely acute or early subacute. Correlation with clinical exam and further evaluation with MRI recommended. There is no acute intracranial hemorrhage. No mass effect or midline shift noted. The ventricles and sulci are appropriate in size for patient's age. Vascular: No hyperdense vessel or unexpected calcification. Skull: Normal. Negative for fracture or focal lesion. Sinuses/Orbits: The visualized paranasal sinuses are clear. There is partial opacification of the left mastoid air cells. The right mastoid air cells are clear. Other: None IMPRESSION: 1. Right thalamic ischemic infarct, likely acute or subacute. Correlation with clinical exam and further evaluation with MRI recommended. 2. No acute intracranial hemorrhage. These results were called by telephone at the time of interpretation on 01/18/2017 at 6:47 pm to Dr. Jacqulyn Bath, who verbally acknowledged these results. Electronically Signed   By: Elgie Collard M.D.   On: 01/18/2017 19:15   Ct Angio  Neck W Or Wo Contrast  Result Date: 01/18/2017 CLINICAL DATA:  Left-sided facial numbness EXAM: CT ANGIOGRAPHY HEAD AND NECK TECHNIQUE: Multidetector CT imaging of the head and neck was performed using the standard protocol during bolus administration of intravenous contrast. Multiplanar CT image reconstructions and MIPs were obtained to evaluate the vascular anatomy.  Carotid stenosis measurements (when applicable) are obtained utilizing NASCET criteria, using the distal internal carotid diameter as the denominator. CONTRAST:  50 mL Isovue 370 COMPARISON:  Head CT 01/18/2017 FINDINGS: CTA NECK FINDINGS Aortic arch: There is no aneurysm or dissection of the visualized ascending aorta or aortic arch. There is a normal 3 vessel branching pattern. The visualized proximal subclavian arteries are normal. Right carotid system: There is mixed calcified and noncalcified atherosclerotic plaque within the proximal right internal carotid artery without hemodynamically significant stenosis by NASCET criteria. The remainder of the right ICA is normal. Left carotid system: There is mixed atherosclerotic calcified and noncalcified plaque within the proximal left internal carotid artery without hemodynamically significant stenosis. Otherwise normal. Vertebral arteries: The vertebral system is left dominant. Both vertebral artery origins are normal. Both vertebral arteries are normal to their confluence with the basilar artery. Skeleton: There is no bony spinal canal stenosis. No lytic or blastic lesions. Other neck: The nasopharynx is clear. The oropharynx and hypopharynx are normal. The epiglottis is normal. The supraglottic larynx, glottis and subglottic larynx are normal. No retropharyngeal collection. The parapharyngeal spaces are preserved. The parotid and submandibular glands are normal. No sialolithiasis or salivary ductal dilatation. The thyroid gland is normal. There is no cervical lymphadenopathy. Upper chest: No pneumothorax or pleural effusion. No nodules or masses. Review of the MIP images confirms the above findings CTA HEAD FINDINGS Anterior circulation: --Intracranial internal carotid arteries: There is atherosclerotic calcification of both clinoid segments with a small amount of noncalcified plaque within the right distal clinoid/proximal communicating segment. --Anterior  cerebral arteries: Normal. --Middle cerebral arteries: Normal. --Posterior communicating arteries: There is a small P-comm on the left. Posterior circulation: --Posterior cerebral arteries: Normal. --Superior cerebellar arteries: Normal. --Basilar artery: Normal. --Anterior inferior cerebellar arteries: Normal. --Posterior inferior cerebellar arteries: Normal. Venous sinuses: As permitted by contrast timing, patent. Anatomic variants: None Delayed phase: No parenchymal contrast enhancement. Review of the MIP images confirms the above findings IMPRESSION: 1. No emergent large vessel occlusion. 2. Bilateral mixed calcified and noncalcified atherosclerotic plaque within the proximal internal carotid arteries without hemodynamically significant stenosis by NASCET criteria. 3. Bilateral calcific atherosclerosis of the supraclinoid internal carotid arteries with a small amount of noncalcified plaque in the communicating segment of the right ICA. Electronically Signed   By: Deatra Robinson M.D.   On: 01/18/2017 22:58   Mr Brain Wo Contrast  Result Date: 01/19/2017 CLINICAL DATA:  Hypertension. Left facial weakness beginning yesterday. Dizziness and headache. EXAM: MRI HEAD WITHOUT CONTRAST TECHNIQUE: Multiplanar, multiecho pulse sequences of the brain and surrounding structures were obtained without intravenous contrast. COMPARISON:  CT studies 01/18/2017 FINDINGS: Brain: Diffusion imaging shows a 1.4 cm acute infarction of the right thalamus. No other acute infarction. The brainstem and cerebellum are normal. Cerebral hemispheres are otherwise normal. No evidence of prior infarction. No mass lesion, hemorrhage, hydrocephalus or extra-axial collection. Vascular: Major vessels at the base of the brain show flow. Right vertebral artery is a tiny vessel shown to be patent by CT angiography. Skull and upper cervical spine: Negative Sinuses/Orbits: Sinuses are clear. Orbits are normal. Mastoid effusion on the left. Right  mastoid is clear. Other: None  IMPRESSION: 1.4 cm acute infarction affecting the right thalamus. No evidence of hemorrhage or mass effect. Brain otherwise appears normal. Diminutive right vertebral artery shown to be patent by CT angiography. Left mastoid effusion Electronically Signed   By: Paulina Fusi M.D.   On: 01/19/2017 13:45    TODAY-DAY OF DISCHARGE:  Subjective:   Craig Atkinson today has no headache,no chest abdominal pain,no new weakness tingling or numbness, feels much better wants to go home today.   Objective:   Blood pressure (!) 151/87, pulse 67, temperature 99 F (37.2 C), temperature source Oral, resp. rate 20, height 5\' 2"  (1.575 m), weight 78.3 kg (172 lb 9.6 oz), SpO2 99 %.  Intake/Output Summary (Last 24 hours) at 01/19/17 1439 Last data filed at 01/19/17 1226  Gross per 24 hour  Intake          1178.75 ml  Output                0 ml  Net          1178.75 ml   Filed Weights   01/19/17 0000  Weight: 78.3 kg (172 lb 9.6 oz)    Exam: Awake Alert, Oriented *3, No new F.N deficits, Normal affect [place].AT,PERRAL Supple Neck,No JVD, No cervical lymphadenopathy appriciated.  Symmetrical Chest wall movement, Good air movement bilaterally, CTAB RRR,No Gallops,Rubs or new Murmurs, No Parasternal Heave +ve B.Sounds, Abd Soft, Non tender, No organomegaly appriciated, No rebound -guarding or rigidity. No Cyanosis, Clubbing or edema, No new Rash or bruise   PERTINENT RADIOLOGIC STUDIES: Ct Angio Head W Or Wo Contrast  Result Date: 01/18/2017 CLINICAL DATA:  Left-sided facial numbness EXAM: CT ANGIOGRAPHY HEAD AND NECK TECHNIQUE: Multidetector CT imaging of the head and neck was performed using the standard protocol during bolus administration of intravenous contrast. Multiplanar CT image reconstructions and MIPs were obtained to evaluate the vascular anatomy. Carotid stenosis measurements (when applicable) are obtained utilizing NASCET criteria, using the distal internal  carotid diameter as the denominator. CONTRAST:  50 mL Isovue 370 COMPARISON:  Head CT 01/18/2017 FINDINGS: CTA NECK FINDINGS Aortic arch: There is no aneurysm or dissection of the visualized ascending aorta or aortic arch. There is a normal 3 vessel branching pattern. The visualized proximal subclavian arteries are normal. Right carotid system: There is mixed calcified and noncalcified atherosclerotic plaque within the proximal right internal carotid artery without hemodynamically significant stenosis by NASCET criteria. The remainder of the right ICA is normal. Left carotid system: There is mixed atherosclerotic calcified and noncalcified plaque within the proximal left internal carotid artery without hemodynamically significant stenosis. Otherwise normal. Vertebral arteries: The vertebral system is left dominant. Both vertebral artery origins are normal. Both vertebral arteries are normal to their confluence with the basilar artery. Skeleton: There is no bony spinal canal stenosis. No lytic or blastic lesions. Other neck: The nasopharynx is clear. The oropharynx and hypopharynx are normal. The epiglottis is normal. The supraglottic larynx, glottis and subglottic larynx are normal. No retropharyngeal collection. The parapharyngeal spaces are preserved. The parotid and submandibular glands are normal. No sialolithiasis or salivary ductal dilatation. The thyroid gland is normal. There is no cervical lymphadenopathy. Upper chest: No pneumothorax or pleural effusion. No nodules or masses. Review of the MIP images confirms the above findings CTA HEAD FINDINGS Anterior circulation: --Intracranial internal carotid arteries: There is atherosclerotic calcification of both clinoid segments with a small amount of noncalcified plaque within the right distal clinoid/proximal communicating segment. --Anterior cerebral arteries: Normal. --Middle cerebral arteries:  Normal. --Posterior communicating arteries: There is a small  P-comm on the left. Posterior circulation: --Posterior cerebral arteries: Normal. --Superior cerebellar arteries: Normal. --Basilar artery: Normal. --Anterior inferior cerebellar arteries: Normal. --Posterior inferior cerebellar arteries: Normal. Venous sinuses: As permitted by contrast timing, patent. Anatomic variants: None Delayed phase: No parenchymal contrast enhancement. Review of the MIP images confirms the above findings IMPRESSION: 1. No emergent large vessel occlusion. 2. Bilateral mixed calcified and noncalcified atherosclerotic plaque within the proximal internal carotid arteries without hemodynamically significant stenosis by NASCET criteria. 3. Bilateral calcific atherosclerosis of the supraclinoid internal carotid arteries with a small amount of noncalcified plaque in the communicating segment of the right ICA. Electronically Signed   By: Deatra Robinson M.D.   On: 01/18/2017 22:58   Ct Head Wo Contrast  Result Date: 01/18/2017 CLINICAL DATA:  56 year old male with dizziness and slurred speech and numbness on the left side of the face. EXAM: CT HEAD WITHOUT CONTRAST TECHNIQUE: Contiguous axial images were obtained from the base of the skull through the vertex without intravenous contrast. COMPARISON:  None FINDINGS: Brain: There is a 1.0 x 1.2 cm hypoattenuating area in the right thalamus most consistent with an ischemic infarct, likely acute or early subacute. Correlation with clinical exam and further evaluation with MRI recommended. There is no acute intracranial hemorrhage. No mass effect or midline shift noted. The ventricles and sulci are appropriate in size for patient's age. Vascular: No hyperdense vessel or unexpected calcification. Skull: Normal. Negative for fracture or focal lesion. Sinuses/Orbits: The visualized paranasal sinuses are clear. There is partial opacification of the left mastoid air cells. The right mastoid air cells are clear. Other: None IMPRESSION: 1. Right thalamic  ischemic infarct, likely acute or subacute. Correlation with clinical exam and further evaluation with MRI recommended. 2. No acute intracranial hemorrhage. These results were called by telephone at the time of interpretation on 01/18/2017 at 6:47 pm to Dr. Jacqulyn Bath, who verbally acknowledged these results. Electronically Signed   By: Elgie Collard M.D.   On: 01/18/2017 19:15   Ct Angio Neck W Or Wo Contrast  Result Date: 01/18/2017 CLINICAL DATA:  Left-sided facial numbness EXAM: CT ANGIOGRAPHY HEAD AND NECK TECHNIQUE: Multidetector CT imaging of the head and neck was performed using the standard protocol during bolus administration of intravenous contrast. Multiplanar CT image reconstructions and MIPs were obtained to evaluate the vascular anatomy. Carotid stenosis measurements (when applicable) are obtained utilizing NASCET criteria, using the distal internal carotid diameter as the denominator. CONTRAST:  50 mL Isovue 370 COMPARISON:  Head CT 01/18/2017 FINDINGS: CTA NECK FINDINGS Aortic arch: There is no aneurysm or dissection of the visualized ascending aorta or aortic arch. There is a normal 3 vessel branching pattern. The visualized proximal subclavian arteries are normal. Right carotid system: There is mixed calcified and noncalcified atherosclerotic plaque within the proximal right internal carotid artery without hemodynamically significant stenosis by NASCET criteria. The remainder of the right ICA is normal. Left carotid system: There is mixed atherosclerotic calcified and noncalcified plaque within the proximal left internal carotid artery without hemodynamically significant stenosis. Otherwise normal. Vertebral arteries: The vertebral system is left dominant. Both vertebral artery origins are normal. Both vertebral arteries are normal to their confluence with the basilar artery. Skeleton: There is no bony spinal canal stenosis. No lytic or blastic lesions. Other neck: The nasopharynx is clear. The  oropharynx and hypopharynx are normal. The epiglottis is normal. The supraglottic larynx, glottis and subglottic larynx are normal. No retropharyngeal collection. The parapharyngeal  spaces are preserved. The parotid and submandibular glands are normal. No sialolithiasis or salivary ductal dilatation. The thyroid gland is normal. There is no cervical lymphadenopathy. Upper chest: No pneumothorax or pleural effusion. No nodules or masses. Review of the MIP images confirms the above findings CTA HEAD FINDINGS Anterior circulation: --Intracranial internal carotid arteries: There is atherosclerotic calcification of both clinoid segments with a small amount of noncalcified plaque within the right distal clinoid/proximal communicating segment. --Anterior cerebral arteries: Normal. --Middle cerebral arteries: Normal. --Posterior communicating arteries: There is a small P-comm on the left. Posterior circulation: --Posterior cerebral arteries: Normal. --Superior cerebellar arteries: Normal. --Basilar artery: Normal. --Anterior inferior cerebellar arteries: Normal. --Posterior inferior cerebellar arteries: Normal. Venous sinuses: As permitted by contrast timing, patent. Anatomic variants: None Delayed phase: No parenchymal contrast enhancement. Review of the MIP images confirms the above findings IMPRESSION: 1. No emergent large vessel occlusion. 2. Bilateral mixed calcified and noncalcified atherosclerotic plaque within the proximal internal carotid arteries without hemodynamically significant stenosis by NASCET criteria. 3. Bilateral calcific atherosclerosis of the supraclinoid internal carotid arteries with a small amount of noncalcified plaque in the communicating segment of the right ICA. Electronically Signed   By: Deatra Robinson M.D.   On: 01/18/2017 22:58   Mr Brain Wo Contrast  Result Date: 01/19/2017 CLINICAL DATA:  Hypertension. Left facial weakness beginning yesterday. Dizziness and headache. EXAM: MRI HEAD  WITHOUT CONTRAST TECHNIQUE: Multiplanar, multiecho pulse sequences of the brain and surrounding structures were obtained without intravenous contrast. COMPARISON:  CT studies 01/18/2017 FINDINGS: Brain: Diffusion imaging shows a 1.4 cm acute infarction of the right thalamus. No other acute infarction. The brainstem and cerebellum are normal. Cerebral hemispheres are otherwise normal. No evidence of prior infarction. No mass lesion, hemorrhage, hydrocephalus or extra-axial collection. Vascular: Major vessels at the base of the brain show flow. Right vertebral artery is a tiny vessel shown to be patent by CT angiography. Skull and upper cervical spine: Negative Sinuses/Orbits: Sinuses are clear. Orbits are normal. Mastoid effusion on the left. Right mastoid is clear. Other: None IMPRESSION: 1.4 cm acute infarction affecting the right thalamus. No evidence of hemorrhage or mass effect. Brain otherwise appears normal. Diminutive right vertebral artery shown to be patent by CT angiography. Left mastoid effusion Electronically Signed   By: Paulina Fusi M.D.   On: 01/19/2017 13:45     PERTINENT LAB RESULTS: CBC:  Recent Labs  01/18/17 1725 01/18/17 1747  WBC 7.1  --   HGB 13.0 13.6  HCT 38.4* 40.0  PLT 335  --    CMET CMP     Component Value Date/Time   NA 140 01/18/2017 1747   K 3.9 01/18/2017 1747   CL 103 01/18/2017 1747   CO2 27 01/18/2017 1725   GLUCOSE 116 (H) 01/18/2017 1747   BUN 12 01/18/2017 1747   CREATININE 1.10 01/18/2017 1747   CALCIUM 9.7 01/18/2017 1725   PROT 6.8 01/18/2017 1725   ALBUMIN 4.2 01/18/2017 1725   AST 15 01/18/2017 1725   ALT 18 01/18/2017 1725   ALKPHOS 50 01/18/2017 1725   BILITOT 0.8 01/18/2017 1725   GFRNONAA >60 01/18/2017 1725   GFRAA >60 01/18/2017 1725    GFR Estimated Creatinine Clearance: 68 mL/min (by C-G formula based on SCr of 1.1 mg/dL). No results for input(s): LIPASE, AMYLASE in the last 72 hours. No results for input(s): CKTOTAL, CKMB,  CKMBINDEX, TROPONINI in the last 72 hours. Invalid input(s): POCBNP No results for input(s): DDIMER in the last 72 hours.  No results for input(s): HGBA1C in the last 72 hours.  Recent Labs  01/19/17 0947  CHOL 212*  HDL 39*  LDLCALC 144*  TRIG 147  CHOLHDL 5.4   No results for input(s): TSH, T4TOTAL, T3FREE, THYROIDAB in the last 72 hours.  Invalid input(s): FREET3 No results for input(s): VITAMINB12, FOLATE, FERRITIN, TIBC, IRON, RETICCTPCT in the last 72 hours. Coags:  Recent Labs  01/18/17 1725  INR 1.02   Microbiology: No results found for this or any previous visit (from the past 240 hour(s)).  FURTHER DISCHARGE INSTRUCTIONS:  Get Medicines reviewed and adjusted: Please take all your medications with you for your next visit with your Primary MD  Laboratory/radiological data: Please request your Primary MD to go over all hospital tests and procedure/radiological results at the follow up, please ask your Primary MD to get all Hospital records sent to his/her office.  In some cases, they will be blood work, cultures and biopsy results pending at the time of your discharge. Please request that your primary care M.D. goes through all the records of your hospital data and follows up on these results.  Also Note the following: If you experience worsening of your admission symptoms, develop shortness of breath, life threatening emergency, suicidal or homicidal thoughts you must seek medical attention immediately by calling 911 or calling your MD immediately  if symptoms less severe.  You must read complete instructions/literature along with all the possible adverse reactions/side effects for all the Medicines you take and that have been prescribed to you. Take any new Medicines after you have completely understood and accpet all the possible adverse reactions/side effects.   Do not drive when taking Pain medications or sleeping medications (Benzodaizepines)  Do not take more  than prescribed Pain, Sleep and Anxiety Medications. It is not advisable to combine anxiety,sleep and pain medications without talking with your primary care practitioner  Special Instructions: If you have smoked or chewed Tobacco  in the last 2 yrs please stop smoking, stop any regular Alcohol  and or any Recreational drug use.  Wear Seat belts while driving.  Please note: You were cared for by a hospitalist during your hospital stay. Once you are discharged, your primary care physician will handle any further medical issues. Please note that NO REFILLS for any discharge medications will be authorized once you are discharged, as it is imperative that you return to your primary care physician (or establish a relationship with a primary care physician if you do not have one) for your post hospital discharge needs so that they can reassess your need for medications and monitor your lab values.  Total Time spent coordinating discharge including counseling, education and face to face time equals 45 minutes.  SignedJeoffrey Massed: Abrina Petz 01/19/2017 2:39 PM

## 2017-01-19 NOTE — Progress Notes (Signed)
PROGRESS NOTE        PATIENT DETAILS Name: Craig Atkinson Age: 56 y.o. Sex: male Date of Birth: 21-Feb-1961 Admit Date: 01/18/2017 Admitting Physician Therisa Doyne, MD PCP:No PCP Per Patient  Brief Narrative: Patient is a 56 y.o. male with past medical history of hypertension, cocaine use-presented with left facial numbness, slight slurred speech and left upper extremity weakness-CT scan of the head demonstrated a right thalamic infarct. His last cocaine use was approximately 5 days back. See below for further details  Subjective: Left facial numbness improving-claims that he has more strength in his left upper extremity today.  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 Very minimal left upper extremity weakness. Await MRI brain, echo, A1c.CTA neck without any major stenosis. Telemetry-negative for arrhythmias. LDL 144-start statin. Awaiting A1c and HIV. Continue aspirin, and await further workup. Stroke team following.  Hypertension: BP stable-Iallow permissive hypertension-continue to hold antihypertensives.  Cocaine abuse: Counseled extensively  DVT Prophylaxis: Prophylactic Lovenox  Code Status: Full code  Family Communication: None at bedside  Disposition Plan: Remain inpatient-home when work up complete  Antimicrobial agents: Anti-infectives    None     Procedures: None  CONSULTS:  neurology  Time spent: 25- minutes-Greater than 50% of this time was spent in counseling, explanation of diagnosis, planning of further management, and coordination of care.  MEDICATIONS: Scheduled Meds: . aspirin  300 mg Rectal Daily   Or  . aspirin  325 mg Oral Daily   Continuous Infusions: . sodium chloride 75 mL/hr at 01/19/17 1000   PRN Meds:.acetaminophen **OR** acetaminophen (TYLENOL) oral liquid 160 mg/5 mL **OR** acetaminophen   PHYSICAL EXAM: Vital signs: Vitals:   01/19/17 0000  01/19/17 0200 01/19/17 0400 01/19/17 0600  BP: 120/70 116/69 (!) 105/56 (!) 106/57  Pulse: 62 61 60 61  Resp: 18 18 18 18   Temp:      TempSrc:      SpO2: 99% 99% 100% 100%  Weight: 78.3 kg (172 lb 9.6 oz)     Height: 5\' 2"  (1.575 m)      Filed Weights   01/19/17 0000  Weight: 78.3 kg (172 lb 9.6 oz)   Body mass index is 31.57 kg/m.   General appearance :Awake, alert, not in any distress. Speech Clear.  Eyes:, pupils equally reactive to light and accomodation,no scleral icterus. HEENT: Atraumatic and Normocephalic Neck: supple, no JVD. No cervical lymphadenopathy. No thyromegaly Resp:Good air entry bilaterally, no added sounds  CVS: S1 S2 regular, no murmurs.  GI: Bowel sounds present, Non tender and not distended with no gaurding, rigidity or rebound. Extremities: B/L Lower Ext shows no edema, both legs are warm to touch Neurology:  speech clear, very minimal left upper extremity weakness-but otherwise nonfocal.  Psychiatric: Normal judgment and insight. Alert and oriented x 3. Normal mood. Musculoskeletal:No digital cyanosis Skin:No Rash, warm and dry Wounds:N/A  I have personally reviewed following labs and imaging studies  LABORATORY DATA: CBC:  Recent Labs Lab 01/18/17 1725 01/18/17 1747  WBC 7.1  --   NEUTROABS 4.1  --   HGB 13.0 13.6  HCT 38.4* 40.0  MCV 90.8  --   PLT 335  --     Basic Metabolic Panel:  Recent Labs Lab 01/18/17 1725 01/18/17 1747  NA 138 140  K 3.9 3.9  CL 105 103  CO2  27  --   GLUCOSE 118* 116*  BUN 9 12  CREATININE 0.96 1.10  CALCIUM 9.7  --     GFR: Estimated Creatinine Clearance: 68 mL/min (by C-G formula based on SCr of 1.1 mg/dL).  Liver Function Tests:  Recent Labs Lab 01/18/17 1725  AST 15  ALT 18  ALKPHOS 50  BILITOT 0.8  PROT 6.8  ALBUMIN 4.2   No results for input(s): LIPASE, AMYLASE in the last 168 hours. No results for input(s): AMMONIA in the last 168 hours.  Coagulation Profile:  Recent  Labs Lab 01/18/17 1725  INR 1.02    Cardiac Enzymes: No results for input(s): CKTOTAL, CKMB, CKMBINDEX, TROPONINI in the last 168 hours.  BNP (last 3 results) No results for input(s): PROBNP in the last 8760 hours.  HbA1C: No results for input(s): HGBA1C in the last 72 hours.  CBG: No results for input(s): GLUCAP in the last 168 hours.  Lipid Profile:  Recent Labs  01/19/17 0947  CHOL 212*  HDL 39*  LDLCALC 144*  TRIG 147  CHOLHDL 5.4    Thyroid Function Tests: No results for input(s): TSH, T4TOTAL, FREET4, T3FREE, THYROIDAB in the last 72 hours.  Anemia Panel: No results for input(s): VITAMINB12, FOLATE, FERRITIN, TIBC, IRON, RETICCTPCT in the last 72 hours.  Urine analysis:    Component Value Date/Time   COLORURINE STRAW (A) 01/18/2017 2300   APPEARANCEUR CLEAR 01/18/2017 2300   LABSPEC 1.030 01/18/2017 2300   PHURINE 7.0 01/18/2017 2300   GLUCOSEU NEGATIVE 01/18/2017 2300   HGBUR SMALL (A) 01/18/2017 2300   BILIRUBINUR NEGATIVE 01/18/2017 2300   KETONESUR NEGATIVE 01/18/2017 2300   PROTEINUR NEGATIVE 01/18/2017 2300   NITRITE NEGATIVE 01/18/2017 2300   LEUKOCYTESUR NEGATIVE 01/18/2017 2300    Sepsis Labs: Lactic Acid, Venous No results found for: LATICACIDVEN  MICROBIOLOGY: No results found for this or any previous visit (from the past 240 hour(s)).  RADIOLOGY STUDIES/RESULTS: Ct Angio Head W Or Wo Contrast  Result Date: 01/18/2017 CLINICAL DATA:  Left-sided facial numbness EXAM: CT ANGIOGRAPHY HEAD AND NECK TECHNIQUE: Multidetector CT imaging of the head and neck was performed using the standard protocol during bolus administration of intravenous contrast. Multiplanar CT image reconstructions and MIPs were obtained to evaluate the vascular anatomy. Carotid stenosis measurements (when applicable) are obtained utilizing NASCET criteria, using the distal internal carotid diameter as the denominator. CONTRAST:  50 mL Isovue 370 COMPARISON:  Head CT  01/18/2017 FINDINGS: CTA NECK FINDINGS Aortic arch: There is no aneurysm or dissection of the visualized ascending aorta or aortic arch. There is a normal 3 vessel branching pattern. The visualized proximal subclavian arteries are normal. Right carotid system: There is mixed calcified and noncalcified atherosclerotic plaque within the proximal right internal carotid artery without hemodynamically significant stenosis by NASCET criteria. The remainder of the right ICA is normal. Left carotid system: There is mixed atherosclerotic calcified and noncalcified plaque within the proximal left internal carotid artery without hemodynamically significant stenosis. Otherwise normal. Vertebral arteries: The vertebral system is left dominant. Both vertebral artery origins are normal. Both vertebral arteries are normal to their confluence with the basilar artery. Skeleton: There is no bony spinal canal stenosis. No lytic or blastic lesions. Other neck: The nasopharynx is clear. The oropharynx and hypopharynx are normal. The epiglottis is normal. The supraglottic larynx, glottis and subglottic larynx are normal. No retropharyngeal collection. The parapharyngeal spaces are preserved. The parotid and submandibular glands are normal. No sialolithiasis or salivary ductal dilatation. The thyroid gland  is normal. There is no cervical lymphadenopathy. Upper chest: No pneumothorax or pleural effusion. No nodules or masses. Review of the MIP images confirms the above findings CTA HEAD FINDINGS Anterior circulation: --Intracranial internal carotid arteries: There is atherosclerotic calcification of both clinoid segments with a small amount of noncalcified plaque within the right distal clinoid/proximal communicating segment. --Anterior cerebral arteries: Normal. --Middle cerebral arteries: Normal. --Posterior communicating arteries: There is a small P-comm on the left. Posterior circulation: --Posterior cerebral arteries: Normal.  --Superior cerebellar arteries: Normal. --Basilar artery: Normal. --Anterior inferior cerebellar arteries: Normal. --Posterior inferior cerebellar arteries: Normal. Venous sinuses: As permitted by contrast timing, patent. Anatomic variants: None Delayed phase: No parenchymal contrast enhancement. Review of the MIP images confirms the above findings IMPRESSION: 1. No emergent large vessel occlusion. 2. Bilateral mixed calcified and noncalcified atherosclerotic plaque within the proximal internal carotid arteries without hemodynamically significant stenosis by NASCET criteria. 3. Bilateral calcific atherosclerosis of the supraclinoid internal carotid arteries with a small amount of noncalcified plaque in the communicating segment of the right ICA. Electronically Signed   By: Deatra Robinson M.D.   On: 01/18/2017 22:58   Ct Head Wo Contrast  Result Date: 01/18/2017 CLINICAL DATA:  56 year old male with dizziness and slurred speech and numbness on the left side of the face. EXAM: CT HEAD WITHOUT CONTRAST TECHNIQUE: Contiguous axial images were obtained from the base of the skull through the vertex without intravenous contrast. COMPARISON:  None FINDINGS: Brain: There is a 1.0 x 1.2 cm hypoattenuating area in the right thalamus most consistent with an ischemic infarct, likely acute or early subacute. Correlation with clinical exam and further evaluation with MRI recommended. There is no acute intracranial hemorrhage. No mass effect or midline shift noted. The ventricles and sulci are appropriate in size for patient's age. Vascular: No hyperdense vessel or unexpected calcification. Skull: Normal. Negative for fracture or focal lesion. Sinuses/Orbits: The visualized paranasal sinuses are clear. There is partial opacification of the left mastoid air cells. The right mastoid air cells are clear. Other: None IMPRESSION: 1. Right thalamic ischemic infarct, likely acute or subacute. Correlation with clinical exam and further  evaluation with MRI recommended. 2. No acute intracranial hemorrhage. These results were called by telephone at the time of interpretation on 01/18/2017 at 6:47 pm to Dr. Jacqulyn Bath, who verbally acknowledged these results. Electronically Signed   By: Elgie Collard M.D.   On: 01/18/2017 19:15   Ct Angio Neck W Or Wo Contrast  Result Date: 01/18/2017 CLINICAL DATA:  Left-sided facial numbness EXAM: CT ANGIOGRAPHY HEAD AND NECK TECHNIQUE: Multidetector CT imaging of the head and neck was performed using the standard protocol during bolus administration of intravenous contrast. Multiplanar CT image reconstructions and MIPs were obtained to evaluate the vascular anatomy. Carotid stenosis measurements (when applicable) are obtained utilizing NASCET criteria, using the distal internal carotid diameter as the denominator. CONTRAST:  50 mL Isovue 370 COMPARISON:  Head CT 01/18/2017 FINDINGS: CTA NECK FINDINGS Aortic arch: There is no aneurysm or dissection of the visualized ascending aorta or aortic arch. There is a normal 3 vessel branching pattern. The visualized proximal subclavian arteries are normal. Right carotid system: There is mixed calcified and noncalcified atherosclerotic plaque within the proximal right internal carotid artery without hemodynamically significant stenosis by NASCET criteria. The remainder of the right ICA is normal. Left carotid system: There is mixed atherosclerotic calcified and noncalcified plaque within the proximal left internal carotid artery without hemodynamically significant stenosis. Otherwise normal. Vertebral arteries: The vertebral system  is left dominant. Both vertebral artery origins are normal. Both vertebral arteries are normal to their confluence with the basilar artery. Skeleton: There is no bony spinal canal stenosis. No lytic or blastic lesions. Other neck: The nasopharynx is clear. The oropharynx and hypopharynx are normal. The epiglottis is normal. The supraglottic larynx,  glottis and subglottic larynx are normal. No retropharyngeal collection. The parapharyngeal spaces are preserved. The parotid and submandibular glands are normal. No sialolithiasis or salivary ductal dilatation. The thyroid gland is normal. There is no cervical lymphadenopathy. Upper chest: No pneumothorax or pleural effusion. No nodules or masses. Review of the MIP images confirms the above findings CTA HEAD FINDINGS Anterior circulation: --Intracranial internal carotid arteries: There is atherosclerotic calcification of both clinoid segments with a small amount of noncalcified plaque within the right distal clinoid/proximal communicating segment. --Anterior cerebral arteries: Normal. --Middle cerebral arteries: Normal. --Posterior communicating arteries: There is a small P-comm on the left. Posterior circulation: --Posterior cerebral arteries: Normal. --Superior cerebellar arteries: Normal. --Basilar artery: Normal. --Anterior inferior cerebellar arteries: Normal. --Posterior inferior cerebellar arteries: Normal. Venous sinuses: As permitted by contrast timing, patent. Anatomic variants: None Delayed phase: No parenchymal contrast enhancement. Review of the MIP images confirms the above findings IMPRESSION: 1. No emergent large vessel occlusion. 2. Bilateral mixed calcified and noncalcified atherosclerotic plaque within the proximal internal carotid arteries without hemodynamically significant stenosis by NASCET criteria. 3. Bilateral calcific atherosclerosis of the supraclinoid internal carotid arteries with a small amount of noncalcified plaque in the communicating segment of the right ICA. Electronically Signed   By: Deatra RobinsonKevin  Herman M.D.   On: 01/18/2017 22:58     LOS: 0 days   Jeoffrey MassedGHIMIRE,Lanora Reveron, MD  [facility] Pager:[phone]  If 7PM-7AM, please contact night-coverage [email or web address] Password [email or web address] 01/19/2017, 11:57 AM

## 2017-01-19 NOTE — Progress Notes (Signed)
  Echocardiogram 2D Echocardiogram has been performed.  Nolon RodBrown, Tony 01/19/2017, 10:44 AM

## 2017-01-19 NOTE — Evaluation (Signed)
Occupational Therapy Evaluation Patient Details Name: Craig Atkinson MRN: [id number] DOB: 02/12/1961 Today's Date: 01/19/2017    History of Present Illness 56 y.o. male with past medical history of hypertension, cocaine use-presented with left facial numbness, slight slurred speech and left upper extremity weakness-CT scan of the head demonstrated a right thalamic infarct. MRI pending.   Clinical Impression   Pt admitted with the above diagnoses and presents with below problem list. Pt will benefit from continued acute OT to address the below listed deficits and maximize independence with basic ADLs prior to d/c home. PTA pt was independent with ADLs. Pt is currently setup to min guard with ADLs and functional mobility. Presents with impairments to sensation, fine motor coordination and some mild residual weakness in LUE; LUE dysmetria noted during finger-nose test. Also reporting residual impaired sensation on left side of face.       Follow Up Recommendations  Outpatient OT    Equipment Recommendations  None recommended by OT    Recommendations for Other Services PT consult;Speech consult     Precautions / Restrictions Precautions Precautions: Fall Restrictions Weight Bearing Restrictions: No      Mobility Bed Mobility Overal bed mobility: Modified Independent                Transfers Overall transfer level: Needs assistance Equipment used: None Transfers: Sit to/from Stand Sit to Stand: Supervision              Balance Overall balance assessment: Needs assistance         Standing balance support: No upper extremity supported Standing balance-Leahy Scale: Fair Standing balance comment: stood at sink to complete oral care                            ADL Overall ADL's : Needs assistance/impaired Eating/Feeding: Set up;Sitting   Grooming: Set up;Sitting;Standing;Min guard;Oral care;Wash/dry hands Grooming Details (indicate cue type and  reason): min guard for safety in standing.  Upper Body Bathing: Set up;Sitting   Lower Body Bathing: Min guard;Sit to/from stand   Upper Body Dressing : Set up;Sitting   Lower Body Dressing: Min guard;Sit to/from stand   Toilet Transfer: Min guard;Ambulation   Toileting- Clothing Manipulation and Hygiene: Min guard   Tub/ Shower Transfer: Min guard   Functional mobility during ADLs: Min guard General ADL Comments: Pt completed in-room functional mobility and grooming tasks standing at sink. Some difficulty twisting open lids to toiletry items. Educated on ADL safety with impaired sensation.      Vision Baseline Vision/History: Wears glasses Patient Visual Report: No change from baseline Vision Assessment?: No apparent visual deficits     Perception     Praxis      Pertinent Vitals/Pain Pain Assessment: No/denies pain     Hand Dominance     Extremity/Trunk Assessment Upper Extremity Assessment Upper Extremity Assessment: LUE deficits/detail LUE Deficits / Details: When isolating flexors and extensors 4/5 LUE strength; gross strength 5/5. Impaired sensation and [facility] LUE. Dysmetria noted during finger-nose test.  LUE Sensation: decreased light touch;decreased proprioception LUE Coordination: decreased fine motor   Lower Extremity Assessment Lower Extremity Assessment: Defer to PT evaluation       Communication Communication Communication: No difficulties   Cognition Arousal/Alertness: Awake/alert Behavior During Therapy: WFL for tasks assessed/performed Overall Cognitive Status: Within Functional Limits for tasks assessed  General Comments       Exercises Exercises: Other exercises Other Exercises Other Exercises: Issued [facility] handout and educated on therapeutic activity for treatment of LUE dysmetria    Shoulder Instructions      Home Living Family/patient expects to be discharged to:: Private residence Living Arrangements:  Parent Available Help at Discharge: Family;Available 24 hours/day Type of Home: House Home Access: Level entry     Home Layout: Two level;Other (Comment) (bed/bath downstairs)                          Prior Functioning/Environment Level of Independence: Independent        Comments: works, drives        OT Problem List: Impaired balance (sitting and/or standing);Decreased coordination;Decreased knowledge of use of DME or AE;Decreased knowledge of precautions;Impaired sensation      OT Treatment/Interventions: Self-care/ADL training;Therapeutic exercise;Neuromuscular education;DME and/or AE instruction;Therapeutic activities;Balance training;Patient/family education    OT Goals(Current goals can be found in the care plan section) Acute Rehab OT Goals Patient Stated Goal: home OT Goal Formulation: With patient Time For Goal Achievement: 01/26/17 Potential to Achieve Goals: Good ADL Goals Pt Will Perform Tub/Shower Transfer: with modified independence;ambulating Pt/caregiver will Perform Home Exercise Program: Left upper extremity;With written HEP provided [facility], dysmetria) Additional ADL Goal #1: Pt will be independent with strategies for safe completion of ADLs with impaired sensation.   OT Frequency: Min 2X/week   Barriers to D/C:            Co-evaluation              End of Session    Activity Tolerance: Patient tolerated treatment well Patient left: in bed;with call bell/phone within reach;with chair alarm set  OT Visit Diagnosis: Unsteadiness on feet (R26.81);Other symptoms and signs involving the nervous system (R29.898)                ADL either performed or assessed with clinical judgement  Time: 1037-1059 OT Time Calculation (min): 22 min Charges:  OT General Charges $OT Visit: 1 Procedure OT Evaluation $OT Eval Low Complexity: 1 Procedure G-Codes: OT G-codes **NOT FOR INPATIENT CLASS** Functional Assessment Tool Used: AM-PAC 6 Clicks  Daily Activity Functional Limitation: Self care Self Care Current Status (N8295(G8987): At least 20 percent but less than 40 percent impaired, limited or restricted Self Care Goal Status (A2130(G8988): At least 1 percent but less than 20 percent impaired, limited or restricted     Pilar GrammesMathews, Claudell Wohler H 01/19/2017, 12:42 PM

## 2017-01-19 NOTE — Progress Notes (Signed)
PT Cancellation Note  Patient Details Name: Corinna Linesurvis A Starke MRN: [id number] DOB: 1960/12/13   Cancelled Treatment:    Reason Eval/Treat Not Completed: Patient at procedure or test/unavailable. Pt at MRI. Will check back as time allows.    Colin BroachSabra M. Zuri Lascala PT, DPT  [phone]  01/19/2017, 1:27 PM

## 2017-01-19 NOTE — Progress Notes (Signed)
STROKE TEAM PROGRESS NOTE   HISTORY OF PRESENT ILLNESS (per record) Craig Atkinson is a 56 y.o. male who awoke the morning of 3/15 with numbness on the left side of his face and mild difficult walking. He states that he also noticed some mild slurred speech when talking to a friend. This persisted throughout the day, and was still ongoing today and therefore he sought care in the emergency department this afternoon. Here, a CT scan was performed which demonstrates right thalamic infarct. He last used cocaine 5 days ago.   LKW: 3/14, prior to bed tpa given?: no, out of window   SUBJECTIVE (INTERVAL HISTORY) His family was not at bedside.  He was without complaint overnight and states dizziness.     OBJECTIVE Temp:  [97.9 F (36.6 C)-98.1 F (36.7 C)] 97.9 F (36.6 C) (03/16 2330) Pulse Rate:  [60-70] 61 (03/17 0600) Cardiac Rhythm: Normal sinus rhythm (03/16 2330) Resp:  [18] 18 (03/17 0600) BP: (105-148)/(56-79) 106/57 (03/17 0600) SpO2:  [99 %-100 %] 100 % (03/17 0600) Weight:  [78.3 kg (172 lb 9.6 oz)] 78.3 kg (172 lb 9.6 oz) (03/17 0000)  CBC:  Recent Labs Lab 01/18/17 1725 01/18/17 1747  WBC 7.1  --   NEUTROABS 4.1  --   HGB 13.0 13.6  HCT 38.4* 40.0  MCV 90.8  --   PLT 335  --     Basic Metabolic Panel:  Recent Labs Lab 01/18/17 1725 01/18/17 1747  NA 138 140  K 3.9 3.9  CL 105 103  CO2 27  --   GLUCOSE 118* 116*  BUN 9 12  CREATININE 0.96 1.10  CALCIUM 9.7  --     Lipid Panel: No results found for: CHOL, TRIG, HDL, CHOLHDL, VLDL, LDLCALC HgbA1c: No results found for: HGBA1C Urine Drug Screen:    Component Value Date/Time   LABOPIA NONE DETECTED 01/18/2017 2300   COCAINSCRNUR POSITIVE (A) 01/18/2017 2300   LABBENZ NONE DETECTED 01/18/2017 2300   AMPHETMU NONE DETECTED 01/18/2017 2300   THCU NONE DETECTED 01/18/2017 2300   LABBARB NONE DETECTED 01/18/2017 2300      IMAGING  Ct Angio Head W Or Wo Contrast 01/18/2017 1. No emergent large  vessel occlusion.  2. Bilateral mixed calcified and noncalcified atherosclerotic plaque within the proximal internal carotid arteries without hemodynamically significant stenosis by NASCET criteria.  3. Bilateral calcific atherosclerosis of the supraclinoid internal carotid arteries with a small amount of noncalcified plaque in the communicating segment of the right ICA.    Ct Head Wo Contrast 01/18/2017 1. Right thalamic ischemic infarct, likely acute or subacute. Correlation with clinical exam and further evaluation with MRI recommended.  2. No acute intracranial hemorrhage.    Ct Angio Neck W Or Wo Contrast 01/18/2017 1. No emergent large vessel occlusion.  2. Bilateral mixed calcified and noncalcified atherosclerotic plaque within the proximal internal carotid arteries without hemodynamically significant stenosis by NASCET criteria.  3. Bilateral calcific atherosclerosis of the supraclinoid internal carotid arteries with a small amount of noncalcified plaque in the communicating segment of the right ICA.    PHYSICAL EXAM Physical Exam General - Well nourished, well developed, in NAD   Cardiovascular - Regular rate and rhythm Pulmonary: CTA Abdomen: NT, ND, normal bowel sounds Extremities: No C/C/E  Neurological Exam Mental Status: Normal Orientation:  Oriented to person, place and time Speech:  Fluent; no dysarthria Cranial Nerves:  PERRL; EOMI; visual fields full, face grossly symmetric, hearing grossly intact; shrug symmetric and tongue midline  Motor Exam:  Tone:  Within normal limits; Strength: 5/5 throughout  Sensory: Intact to light touch throughout  Coordination:  Heel to shin normal; consistent pass pointing on the left; right finger to nose intact  Gait: Deferred  ASSESSMENT/PLAN Mr. Craig Atkinson is a 56 y.o. male with history of hypertension presenting with left sided numbness, slurred speech, and gait disturbance.  He did not receive IV t-PA due to late  presentation.  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 Probable small vessel disease.  Resultant  Ataxia of left UE  MRI - Right thalamic ischemic infarct  MRA - pending  Carotid Doppler - CTA neck  2D Echo - pending  LDL - pending  HgbA1c - pending  HIV - pending  VTE prophylaxis - SCDs  Diet Heart Room service appropriate? Yes; Fluid consistency: Thin  No antithrombotic prior to admission, now on aspirin 325 mg daily  Patient counseled to be compliant with his antithrombotic medications  Ongoing aggressive stroke risk factor management  Therapy recommendations: pending  Disposition: Pending  Hypertension  Blood pressure tends to run low  Permissive hypertension (OK if < 220/120) but gradually normalize in 5-7 days  Long-term BP goal normotensive  Hyperlipidemia  Home meds: No lipid lowering medications prior to admission  LDL pending, goal < 70    Other Stroke Risk Factors  ETOH use, advised to drink no more than 1 - 2 drink(s) a day  Obesity, Body mass index is 31.57 kg/m., recommend weight loss, diet and exercise as appropriate   Cocaine use  Other Active Problems  UDS - positive for cocaine  Hospital day # 0  ATTENDING NOTE: Patient was seen and examined by me personally. Documentation reflects findings. The laboratory and radiographic studies reviewed by me. ROS completed by me personally and pertinent positives fully documented   Condition: stable  Assessment and plan completed by me personally and fully documented above. Plans/Recommendations include:     Stroke work-up ongoing  SIGNED BY: Dr. Sula Soda    To contact Stroke Continuity provider, please refer to [email or web address]. After hours, contact General Neurology

## 2017-01-19 NOTE — Evaluation (Signed)
Physical Therapy Evaluation/Discharge Patient Details Name: Craig Atkinson MRN: [id number] DOB: 03/17/1961 Today's Date: 01/19/2017   History of Present Illness  56 y.o. male with past medical history of hypertension, cocaine use-presented with left facial numbness, slight slurred speech and left upper extremity weakness-CT scan of the head demonstrated a right thalamic infarct. MRI pending.  Clinical Impression  Pt presents with the above diagnosis. No significant deficits are noted this session. Pt is able to perform all balance testing, gait and stair negotiation with independence to minimal supervision for balance training just for safety. Prior to admission, pt was completely independent and lives in his parents basement. Pt is able to perform all mobility safely enough to return home without any additional therapy services nor assistive devices. All education is complete and no further acute follow-up is required.     Follow Up Recommendations No PT follow up    Equipment Recommendations  None recommended by PT    Recommendations for Other Services       Precautions / Restrictions Precautions Precautions: Fall Restrictions Weight Bearing Restrictions: No      Mobility  Bed Mobility Overal bed mobility: Independent                Transfers Overall transfer level: Needs assistance Equipment used: None Transfers: Sit to/from Stand Sit to Stand: Independent         General transfer comment: Able to get up from bed without assistance  Ambulation/Gait Ambulation/Gait assistance: Supervision;Independent Ambulation Distance (Feet): 150 Feet Assistive device: None Gait Pattern/deviations: WFL(Within Functional Limits) Gait velocity: WFL Gait velocity interpretation: at or above normal speed for age/gender General Gait Details: good gait speed, good step length. No lob. Supervision initally progressing to indepenent  Stairs Stairs: Yes Stairs assistance:  Independent Stair Management: No rails Number of Stairs: 3    Wheelchair Mobility    Modified Rankin (Stroke Patients Only) Modified Rankin (Stroke Patients Only) Pre-Morbid Rankin Score: No symptoms Modified Rankin: No symptoms     Balance Overall balance assessment: Needs assistance         Standing balance support: No upper extremity supported Standing balance-Leahy Scale: Fair                   Standardized Balance Assessment Standardized Balance Assessment : Berg Balance Test Berg Balance Test Sit to Stand: Able to stand without using hands and stabilize independently Standing Unsupported: Able to stand safely 2 minutes Sitting with Back Unsupported but Feet Supported on Floor or Stool: Able to sit safely and securely 2 minutes Stand to Sit: Sits safely with minimal use of hands Transfers: Able to transfer safely, minor use of hands Standing Unsupported with Eyes Closed: Able to stand 10 seconds safely Standing Ubsupported with Feet Together: Able to place feet together independently and stand 1 minute safely From Standing, Reach Forward with Outstretched Arm: Can reach confidently >25 cm (10") From Standing Position, Pick up Object from Floor: Able to pick up shoe safely and easily From Standing Position, Turn to Look Behind Over each Shoulder: Looks behind from both sides and weight shifts well Turn 360 Degrees: Able to turn 360 degrees safely in 4 seconds or less Standing Unsupported, Alternately Place Feet on Step/Stool: Able to stand independently and safely and complete 8 steps in 20 seconds Standing Unsupported, One Foot in Front: Able to take small step independently and hold 30 seconds Standing on One Leg: Able to lift leg independently and hold equal to or more than 3  seconds Total Score: 52         Pertinent Vitals/Pain Pain Assessment: No/denies pain    Home Living Family/patient expects to be discharged to:: Private residence Living  Arrangements: Parent Available Help at Discharge: Family;Available 24 hours/day Type of Home: House Home Access: Level entry     Home Layout: Two level;Other (Comment) (bed and bath downstairs) Home Equipment: None      Prior Function Level of Independence: Independent         Comments: works, drives     Higher education careers adviserHand Dominance   Dominant Hand: Right    Extremity/Trunk Assessment   Upper Extremity Assessment Upper Extremity Assessment: Defer to OT evaluation    Lower Extremity Assessment Lower Extremity Assessment: Overall WFL for tasks assessed    Cervical / Trunk Assessment Cervical / Trunk Assessment: Normal  Communication   Communication: No difficulties  Cognition Arousal/Alertness: Awake/alert Behavior During Therapy: WFL for tasks assessed/performed Overall Cognitive Status: Within Functional Limits for tasks assessed                      General Comments      Exercises     Assessment/Plan    PT Assessment Patent does not need any further PT services  PT Problem List         PT Treatment Interventions      PT Goals (Current goals can be found in the Care Plan section)  Acute Rehab PT Goals Patient Stated Goal: home PT Goal Formulation: With patient Time For Goal Achievement: 01/26/17    Frequency     Barriers to discharge        Co-evaluation               End of Session Equipment Utilized During Treatment: Gait belt Activity Tolerance: Patient tolerated treatment well Patient left: in bed;with call bell/phone within reach;with family/visitor present   PT Visit Diagnosis: Unsteadiness on feet (R26.81)         Time: [id number] PT Time Calculation (min) (ACUTE ONLY): 13 min   Charges:   PT Evaluation $PT Eval Low Complexity: 1 Procedure     PT G Codes:         Colin BroachSabra M. Eluzer Howdeshell PT, DPT  [phone]  01/19/2017, 2:52 PM

## 2017-01-19 NOTE — Care Management Note (Signed)
Case Management Note  Patient Details  Name: Corinna Linesurvis A Zapf MRN: [id number] Date of Birth: [birth year]  Subjective/Objective:                 Spoke with patient and family at the bedside. Explained [facility], to call at 9:00 on Monday to get appointment to establish PCP. Family downloaded good Rx app, lipitor <$10 at [employer]. Family very appreciative of consult, agreed [facility] be [facility]. No further CM needs identified at this time.   Action/Plan:  DC to home.  Expected Discharge Date:  01/19/17               Expected Discharge Plan:  Home/Self Care  In-House Referral:     Discharge planning Services  CM Consult, Medication Assistance, Indigent [facility] Clinic  Post Acute Care Choice:  NA Choice offered to:  Patient, Parent, Sibling  DME Arranged:    DME Agency:     HH Arranged:    HH Agency:     Status of Service:  Completed, signed off  If discussed at [employer] Length of Stay Meetings, dates discussed:    Additional Comments:  Lawerance SabalDebbie Matilde Markie, RN 01/19/2017, 3:28 PM

## 2017-01-19 NOTE — Progress Notes (Signed)
Pt discharge education and instructions completed with pt and family at bedside; all voices understanding and denies any questions. Pt IV and telemetry removed; pt handed his prescriptions for aspirin and Lipitor. Pt discharge home with family to transport him home. Pt transported off unit via wheelchair with belongings and family to the side. Dionne BucyP. Amo Reverie Vaquera RN

## 2017-01-19 NOTE — Progress Notes (Signed)
Came to discuss test results-several family member's at bedside. Patient did not give me permission to discuss test results (UDS + for cocaine)-so asked his family member's to step outside. RN Marcelino DusterMichelle present in room.  Counseled extensively regarding avoiding cocaine use,following which patient asked me to call his family members back into the room-we then discussed other test results.   See d/c summary for details.

## 2017-01-20 LAB — HEMOGLOBIN A1C
HEMOGLOBIN A1C: 5.7 % — AB (ref 4.8–5.6)
Mean Plasma Glucose: 117 mg/dL

## 2017-01-20 LAB — HIV ANTIBODY (ROUTINE TESTING W REFLEX): HIV SCREEN 4TH GENERATION: NONREACTIVE

## 2017-01-24 ENCOUNTER — Ambulatory Visit: Payer: Self-pay | Attending: Internal Medicine | Admitting: Physician Assistant

## 2017-01-24 VITALS — BP 139/83 | HR 62 | Temp 97.8°F | Wt 176.8 lb

## 2017-01-24 DIAGNOSIS — I10 Essential (primary) hypertension: Secondary | ICD-10-CM

## 2017-01-24 DIAGNOSIS — L309 Dermatitis, unspecified: Secondary | ICD-10-CM

## 2017-01-24 DIAGNOSIS — F1490 Cocaine use, unspecified, uncomplicated: Secondary | ICD-10-CM | POA: Insufficient documentation

## 2017-01-24 DIAGNOSIS — Z8673 Personal history of transient ischemic attack (TIA), and cerebral infarction without residual deficits: Secondary | ICD-10-CM | POA: Insufficient documentation

## 2017-01-24 DIAGNOSIS — I639 Cerebral infarction, unspecified: Secondary | ICD-10-CM

## 2017-01-24 MED ORDER — LISINOPRIL 10 MG PO TABS: 10.0000 mg | ORAL_TABLET | Freq: Every day | ORAL | 5 refills | Status: AC

## 2017-01-24 MED ORDER — ATORVASTATIN CALCIUM 40 MG PO TABS: 40.0000 mg | ORAL_TABLET | Freq: Every day | ORAL | 5 refills | Status: DC

## 2017-01-24 MED ORDER — HYDROCORTISONE 1 % EX OINT: 1.0000 "application " | TOPICAL_OINTMENT | Freq: Two times a day (BID) | CUTANEOUS | 0 refills | Status: AC

## 2017-01-24 MED ORDER — ASPIRIN 325 MG PO TABS: 325.0000 mg | ORAL_TABLET | Freq: Every day | ORAL | 5 refills | Status: AC

## 2017-01-24 NOTE — Progress Notes (Signed)
Craig Ridingurvis Araki  ZOX:[id number]  [id number]:[id number]  DOB - 12/26/1960  Chief Complaint  Patient presents with  . Hospitalization Follow-up       Subjective:   Craig Atkinson is a 56 y.o. male here today for Establishment of care. He has a history of hypertension and socially consumes cocaine. On 01/18/2016 he started experiencing some slurred speech, facial numbness on the left, and left arm weakness. The following day he went to the emergency department. His blood pressure was 148/79. A CT scan of the head showed a right thalamus infarct. He was admitted by the internal medicine team. Neurology consult. An MRI confirmed a 1.4 cm acute infarction in the right thalamus region. An echocardiogram was normal. No source of emboli found. Telemetry within normal limits. A CT of the neck normal. Hemoglobin A1c 5.7%. HIV negative. LDL 144.  He was discharged home on aspirin, statin, ACE inhibitor.  He's been compliant with his medications. No chest pain. Breathing doing okay. Less weak on the left side. Able to ambulate without difficulty. Has started a exercise routine. Not using cocaine. Working on his diet.  He also mentions a rash that he gets seasonally on his bilateral rib cage area.  ROS: GEN: denies fever or chills, denies change in weight Skin: denies lesions or rashes HEENT: denies headache, earache, epistaxis, sore throat, or neck pain LUNGS: denies SHOB, dyspnea, PND, orthopnea CV: denies CP or palpitations ABD: denies abd pain, N or V EXT: denies muscle spasms or swelling; no pain in lower ext, no weakness NEURO: denies numbness or tingling, denies sz, stroke or TIA  ALLERGIES: No Known Allergies  PAST MEDICAL HISTORY: Past Medical History:  Diagnosis Date  . Hypertension     PAST SURGICAL HISTORY: No past surgical history on file.  MEDICATIONS AT HOME: Prior to Admission medications   Medication Sig Start Date End Date Taking? Authorizing Provider  aspirin 325 MG  tablet Take 1 tablet (325 mg total) by mouth daily. 01/24/17  Yes Doroteo Nickolson Netta CedarsS Damali Broadfoot, PA-C  atorvastatin (LIPITOR) 40 MG tablet Take 1 tablet (40 mg total) by mouth daily at 6 PM. 01/24/17  Yes Laree Garron Netta CedarsS Tylin Stradley, PA-C  lisinopril (PRINIVIL,ZESTRIL) 10 MG tablet Take 1 tablet (10 mg total) by mouth daily. 01/24/17  Yes Vivianne Masteriffany S Nikita Surman, PA-C  NONFORMULARY OR COMPOUNDED ITEM Please evaluate and treat for outpatient OT Diagnoses: Acute CVA 01/19/17  Yes Shanker Levora DredgeM Ghimire, MD    Family History  Problem Relation Age of Onset  . Diabetes Mother   . Hypertension Mother   . Diabetes Father   . Hypertension Father   . Melanoma Father   . Stroke Neg Hx     @SOCIALHX @  Objective:   Vitals:   01/24/17 0925  BP: 139/83  Pulse: 62  Temp: 97.8 F (36.6 C)  TempSrc: Oral  SpO2: 99%  Weight: 176 lb 12.8 oz (80.2 kg)    Exam General appearance : Awake, alert, not in any distress. Speech Clear. Not toxic looking HEENT: Atraumatic and Normocephalic, pupils equally reactive to light and accomodation Neck: supple, no JVD. No cervical lymphadenopathy.  Chest:Good air entry bilaterally, no added sounds  CVS: S1 S2 regular, no murmurs.  Abdomen: Bowel sounds present, Non tender and not distended with no guarding, rigidity or rebound. Extremities: B/L Lower Ext shows no edema, both legs are warm to touch Neurology: Awake alert, and oriented X 3, CN II-XII grossly intact, LUE slight more weak than right Skin:eczema patches on sides bilaterally Wounds:N/A  Data Review Lab Results  Component Value Date   HGBA1C 5.7 (H) 01/19/2017     Assessment & Plan  1. CVA-right thalamic infarct  -Risk Factor Modification  -ASA, ACE, Statin  -Does not need HHOT or PT 2. HTN  -re check in 2 weeks   -Cont ACE  -Low salt diet discussed 3. Cocaine use  -cessation discussed  -would like to keep this confidential 4. Eczema  -steroid cream   Return in about 2 weeks (around 02/07/2017).  The patient was given  clear instructions to go to ER or return to medical center if symptoms don't improve, worsen or new problems develop. The patient verbalized understanding. The patient was told to call to get lab results if they haven't heard anything in the next week.   This note has been created with Education officer, environmental. Any transcriptional errors are unintentional.    Scot Jun, PA-C [facility] [place], [place] [phone]   01/24/2017, 9:47 AM

## 2017-01-25 NOTE — Progress Notes (Signed)
Late Entry G-Codes     01/19/17 1447  PT G-Codes **NOT FOR INPATIENT CLASS**  Functional Assessment Tool Used AM-PAC 6 Clicks Basic Mobility;Clinical judgement;Berg  Functional Limitation Mobility: Walking and moving around  Mobility: Walking and Moving Around Current Status ([id number](G8978) CH  Mobility: Walking and Moving Around Goal Status [phone](G8979) CH  Mobility: Walking and Moving Around Discharge Status [phone](G8980) CH   Colin BroachSabra M. Emileo Semel PT, DPT  [phone]

## 2017-02-18 ENCOUNTER — Other Ambulatory Visit: Payer: Self-pay | Admitting: Pharmacist

## 2017-02-18 MED ORDER — ATORVASTATIN CALCIUM 40 MG PO TABS: 40.0000 mg | ORAL_TABLET | Freq: Every day | ORAL | 2 refills | Status: AC

## 2017-03-04 ENCOUNTER — Encounter: Payer: Self-pay | Admitting: Family Medicine

## 2017-03-04 ENCOUNTER — Ambulatory Visit: Payer: Self-pay | Attending: Family Medicine | Admitting: Family Medicine

## 2017-03-04 VITALS — BP 133/80 | HR 75 | Temp 98.4°F | Ht 68.0 in | Wt 171.8 lb

## 2017-03-04 DIAGNOSIS — Z7982 Long term (current) use of aspirin: Secondary | ICD-10-CM | POA: Insufficient documentation

## 2017-03-04 DIAGNOSIS — I639 Cerebral infarction, unspecified: Secondary | ICD-10-CM

## 2017-03-04 DIAGNOSIS — F1411 Cocaine abuse, in remission: Secondary | ICD-10-CM | POA: Insufficient documentation

## 2017-03-04 DIAGNOSIS — G8324 Monoplegia of upper limb affecting left nondominant side: Secondary | ICD-10-CM | POA: Insufficient documentation

## 2017-03-04 DIAGNOSIS — I10 Essential (primary) hypertension: Secondary | ICD-10-CM | POA: Insufficient documentation

## 2017-03-04 DIAGNOSIS — I638 Other cerebral infarction: Secondary | ICD-10-CM | POA: Insufficient documentation

## 2017-03-04 NOTE — Patient Instructions (Signed)

## 2017-03-04 NOTE — Progress Notes (Signed)
Subjective:  Patient ID: Craig Atkinson, male    DOB: Jan 29, 1961  Age: 56 y.o. MRN: [id number]  CC: Hypertension; Extremity Weakness (left sided  requesting PT); and Cerebrovascular Accident   HPI Craig Atkinson is a 56 year old male with a history of hypertension, previous cocaine abuse hospitalized at [facility] in 01/2017 for acute infarct of the right thalamus after he presented with left upper extremity weakness. Workup revealed no source of embolus.  He had a hospital follow-up visit with the PA last month and reports compliance with his antihypertensive and statin and is working on losing weight. 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 He no longer uses cocaine.  Past Medical History:  Diagnosis Date  . Hypertension     History reviewed. No pertinent surgical history.  No Known Allergies   Outpatient Medications Prior to Visit  Medication Sig Dispense Refill  . aspirin 325 MG tablet Take 1 tablet (325 mg total) by mouth daily. 30 tablet 5  . atorvastatin (LIPITOR) 40 MG tablet Take 1 tablet (40 mg total) by mouth daily at 6 PM. 30 tablet 2  . hydrocortisone 1 % ointment Apply 1 application topically 2 (two) times daily. 30 g 0  . lisinopril (PRINIVIL,ZESTRIL) 10 MG tablet Take 1 tablet (10 mg total) by mouth daily. 30 tablet 5  . NONFORMULARY OR COMPOUNDED ITEM Please evaluate and treat for outpatient OT Diagnoses: Acute CVA 1 each 0   No facility-administered medications prior to visit.     ROS Review of Systems  Constitutional: Negative for activity change and appetite change.  HENT: Negative for sinus pressure and sore throat.   Eyes: Negative for visual disturbance.  Respiratory: Negative for cough, chest tightness and shortness of breath.   Cardiovascular: Negative for chest pain and leg swelling.  Gastrointestinal: Negative for abdominal distention, abdominal pain, constipation and diarrhea.    Endocrine: Negative.   Genitourinary: Negative for dysuria.  Musculoskeletal: Negative for joint swelling and myalgias.  Skin: Negative for rash.  Allergic/Immunologic: Negative.   Neurological: Negative for weakness, light-headedness and numbness.  Psychiatric/Behavioral: Negative for dysphoric mood and suicidal ideas.    Objective:  BP 133/80 (BP Location: Right Arm, Patient Position: Sitting, Cuff Size: Small)   Pulse 75   Temp 98.4 F (36.9 C) (Oral)   Ht  (1.727 m)   Wt 171 lb 12.8 oz (77.9 kg)   SpO2 98%   BMI 26.12 kg/m   BP/Weight 03/04/2017 01/24/2017 01/19/2017  Systolic BP 133 139 151  Diastolic BP 80 83 87  Wt. (Lbs) 171.8 176.8 172.6  BMI 26.12 32.34 31.57      Physical Exam  Constitutional: He is oriented to person, place, and time. He appears well-developed and well-nourished.  Cardiovascular: Normal rate, normal heart sounds and intact distal pulses.   No murmur heard. Pulmonary/Chest: Effort normal and breath sounds normal. He has no wheezes. He has no rales. He exhibits no tenderness.  Abdominal: Soft. Bowel sounds are normal. He exhibits no distension and no mass. There is no tenderness.  Musculoskeletal: Normal range of motion.  Neurological: He is alert and oriented to person, place, and time.  Motor strength: Left upper extremity-4+/5; right upper extremity-5/5 Right upper extremity-5/5; Right lower extremity- 5/5  Skin: Skin is warm and dry.  Psychiatric: He has a normal mood and affect.     Assessment & Plan:   1. Essential hypertension Controlled  Continue antihypertensives Low-sodium diet  2. Cerebrovascular accident (CVA), unspecified mechanism (HCC) Mild left-sided residual weakness Risk factor modification Strengthening exercises at home   No orders of the defined types were placed in this encounter.   Follow-up: Return in about 3 months (around 06/03/2017) for follow up on CVA.   Jaclyn Shaggy MD

## 2017-06-03 ENCOUNTER — Ambulatory Visit: Payer: Self-pay | Admitting: Family Medicine

## 2018-08-21 IMAGING — MR MR HEAD W/O CM
9 of 10 series · 36 of 48 positions shown · non-contrast
Comparison: CT studies 01/18/2017

CLINICAL DATA: Hypertension. Left facial weakness beginning
yesterday. Dizziness and headache.

EXAM:
MRI HEAD WITHOUT CONTRAST
TECHNIQUE: Multiplanar, multiecho pulse sequences of the brain and surrounding
structures were obtained without intravenous contrast.

[Series 3: DWI · axial · 3.0mm · 1.09mm/px · z∈[-75,+81]mm · 9 of 106 slices shown (1 of 4)]
[im 1/106]
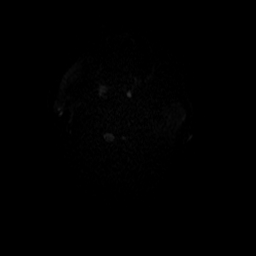
[im 14/106]
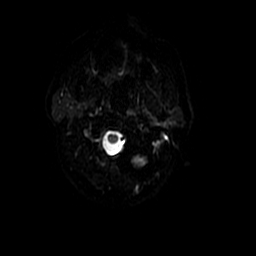
[im 27/106]
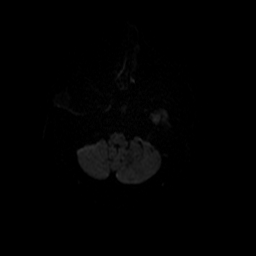
[im 40/106]
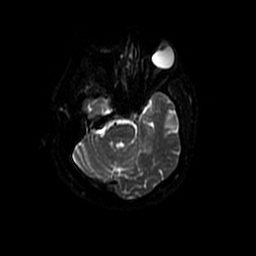
[im 53/106]
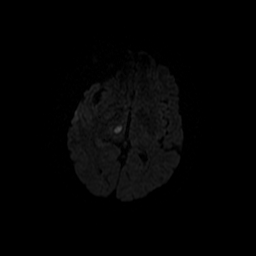
[im 66/106]
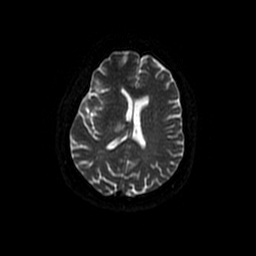
[im 79/106]
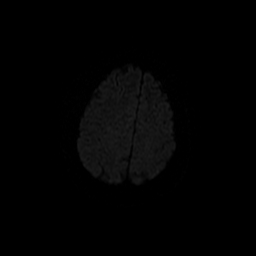
[im 92/106]
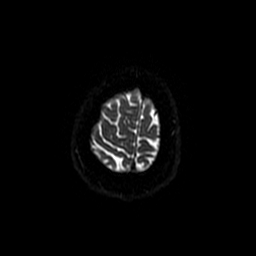
[im 106/106]
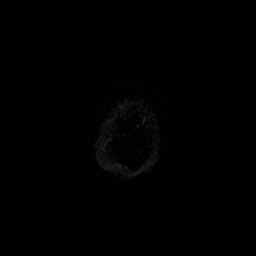

[Series 4: T1 · sagittal · 5.0mm · 0.47mm/px · 2 of 26 slices shown]
[im 1/26]
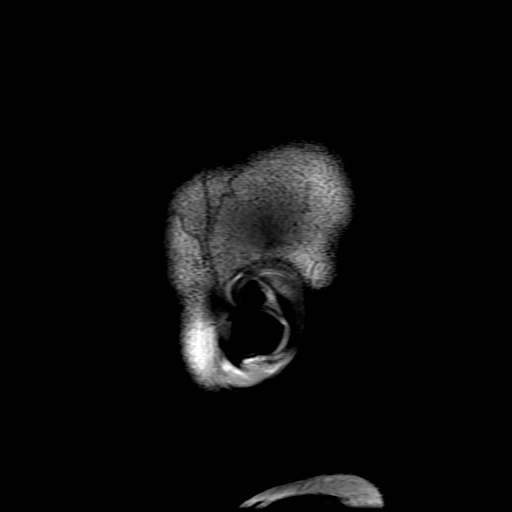
[im 26/26]
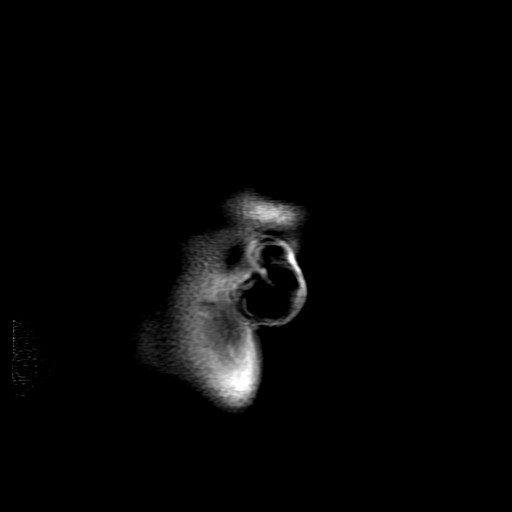

[Series 5: T2 · axial · 5.0mm · 0.43mm/px · z∈[-81,+75]mm · 3 of 27 slices shown]
[im 1/27]
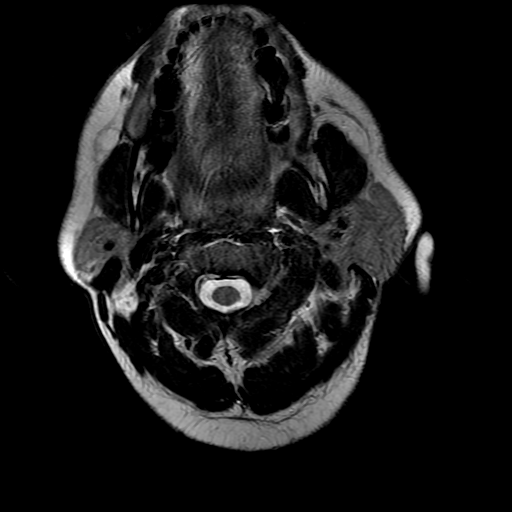
[im 14/27]
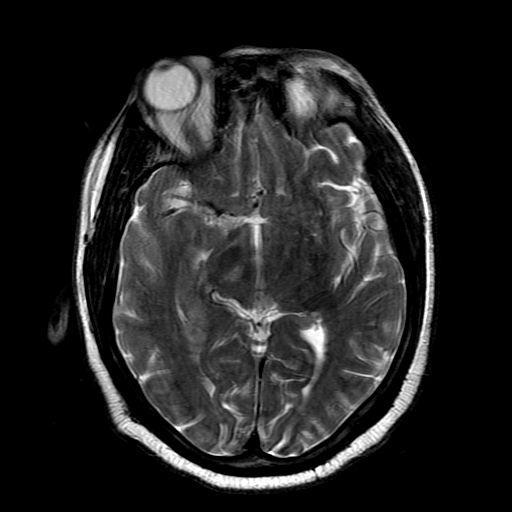
[im 27/27]
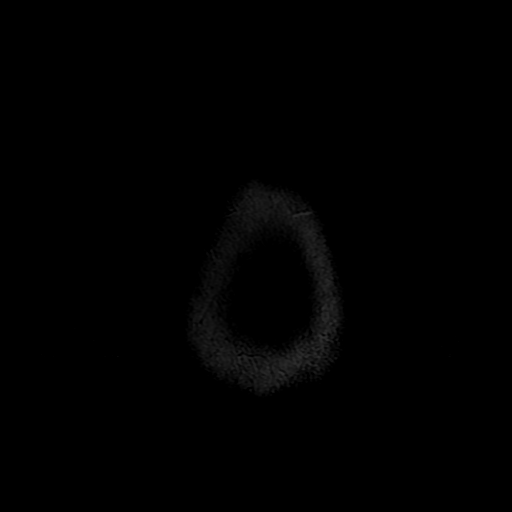

[Series 6: DWI · coronal · 5.0mm · 1.09mm/px · 7 of 70 slices shown (2 of 4)]
[im 1/70]
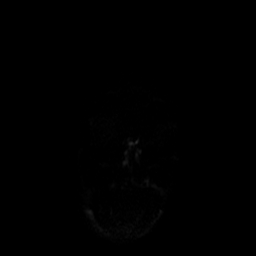
[im 12/70]
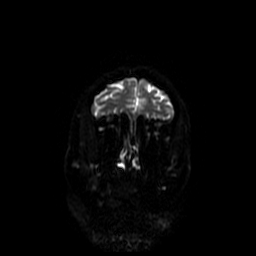
[im 24/70]
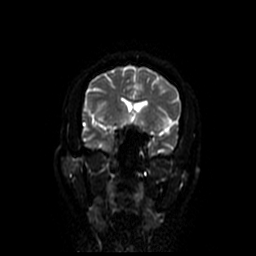
[im 35/70]
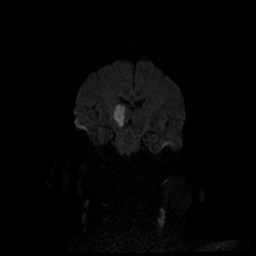
[im 47/70]
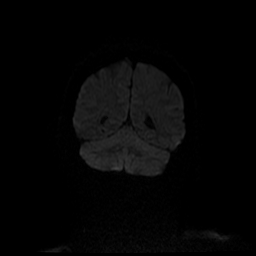
[im 58/70]
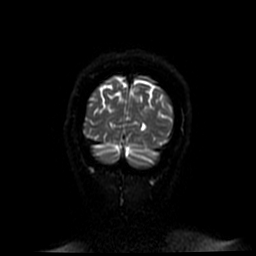
[im 70/70]
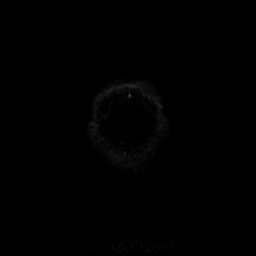

[Series 7: FLAIR · axial · 5.0mm · 0.43mm/px · z∈[-81,+75]mm · 3 of 27 slices shown]
[im 1/27]
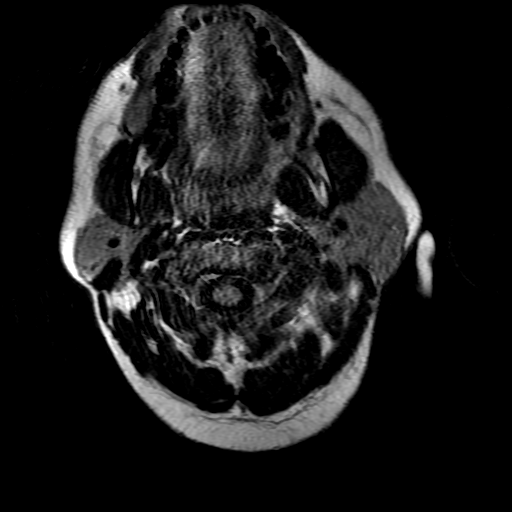
[im 14/27]
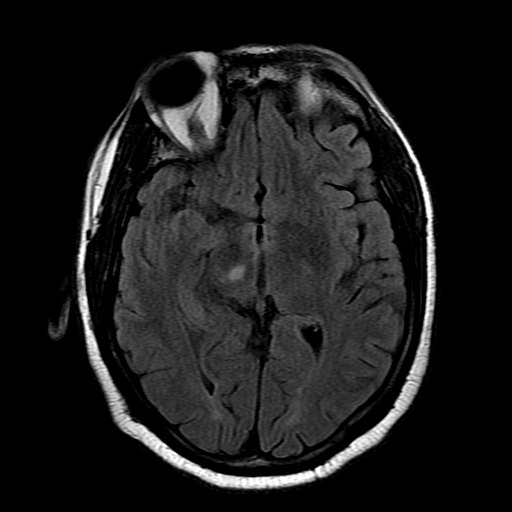
[im 27/27]
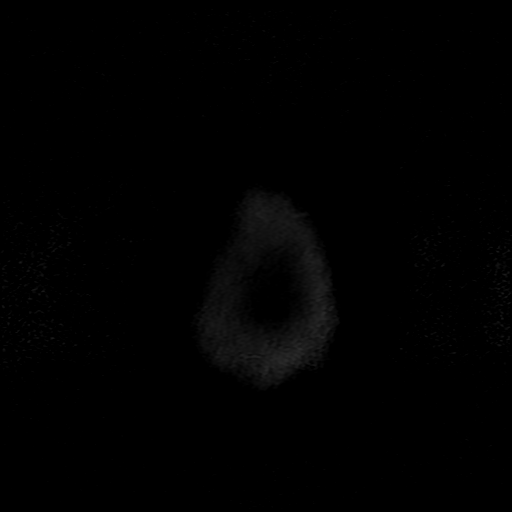

[Series 8: ax mpgr · axial · 5.0mm · 0.43mm/px · 1 of 27 slices shown]
[im 1/27]
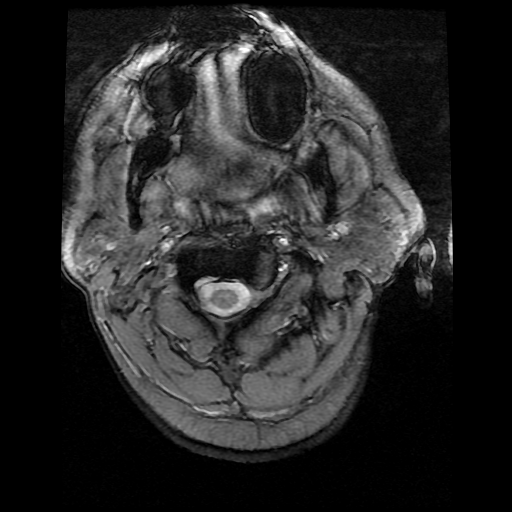

[Series 13: T2 post-contrast · coronal · 5.0mm · 0.39mm/px · 3 of 27 slices shown]
[im 1/27]
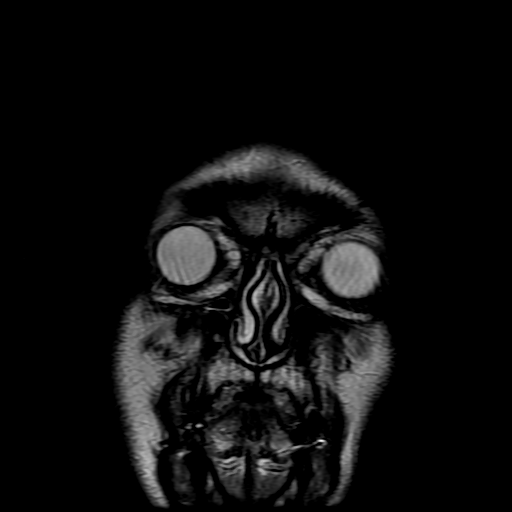
[im 14/27]
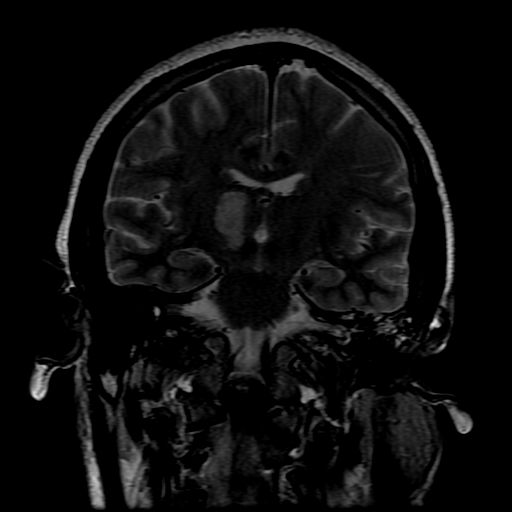
[im 27/27]
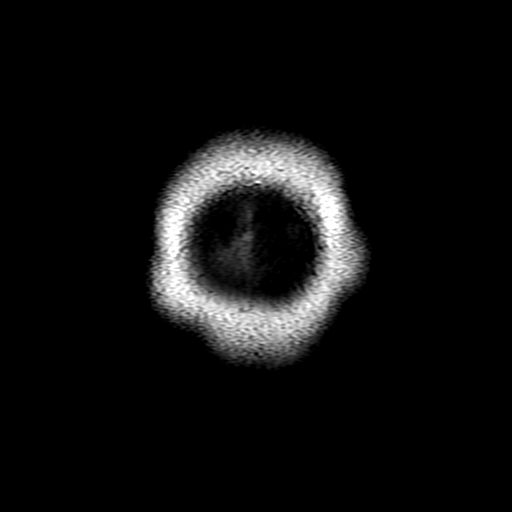

[Series 300: DWI · axial · 3.0mm · 1.09mm/px · z∈[-75,+81]mm · 5 of 53 slices shown (3 of 4)]
[im 1/53]
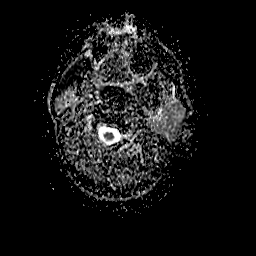
[im 14/53]
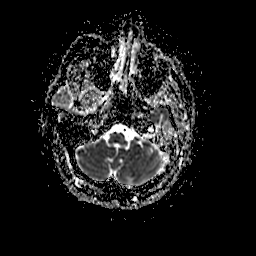
[im 27/53]
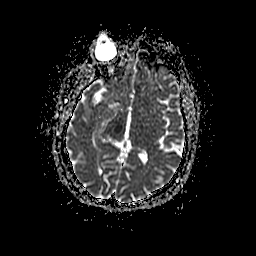
[im 40/53]
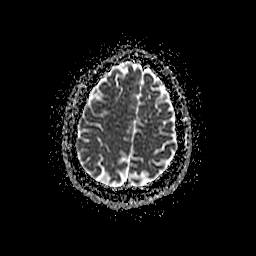
[im 53/53]
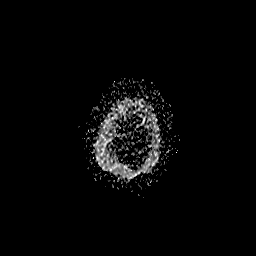

[Series 600: DWI · coronal · 5.0mm · 1.09mm/px · 3 of 35 slices shown (4 of 4)]
[im 1/35]
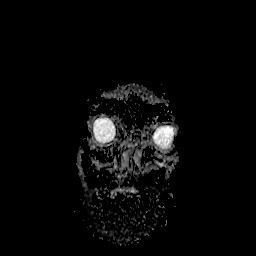
[im 18/35]
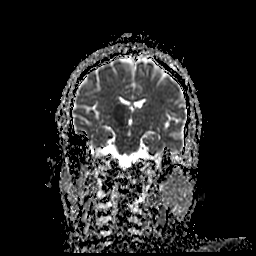
[im 35/35]
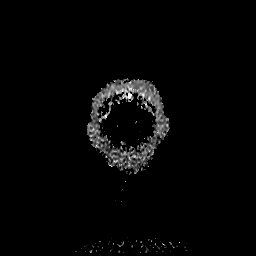

[36 of 48 positions shown; findings below may reference images not displayed]

FINDINGS: Brain: Diffusion imaging shows a 1.4 cm acute infarction of the
right thalamus. No other acute infarction. The brainstem and
cerebellum are normal. Cerebral hemispheres are otherwise normal. No
evidence of prior infarction. No mass lesion, hemorrhage,
hydrocephalus or extra-axial collection.

Vascular: Major vessels at the base of the brain show flow. Right
vertebral artery is a tiny vessel shown to be patent by CT
angiography.

Skull and upper cervical spine: Negative

Sinuses/Orbits: Sinuses are clear. Orbits are normal. Mastoid
effusion on the left. Right mastoid is clear.

Other: None
IMPRESSION: 1.4 cm acute infarction affecting the right thalamus. No evidence of
hemorrhage or mass effect. Brain otherwise appears normal.

Diminutive right vertebral artery shown to be patent by CT
angiography.

Left mastoid effusion

## 2023-06-14 ENCOUNTER — Emergency Department (HOSPITAL_BASED_OUTPATIENT_CLINIC_OR_DEPARTMENT_OTHER): Payer: 59

## 2023-06-14 ENCOUNTER — Emergency Department (HOSPITAL_BASED_OUTPATIENT_CLINIC_OR_DEPARTMENT_OTHER)
Admission: EM | Admit: 2023-06-14 | Discharge: 2023-06-14 | Disposition: A | Discharge status: Home / Self Care | Payer: 59

## 2023-06-14 ENCOUNTER — Encounter (HOSPITAL_BASED_OUTPATIENT_CLINIC_OR_DEPARTMENT_OTHER): Payer: Self-pay | Admitting: Emergency Medicine

## 2023-06-14 ENCOUNTER — Other Ambulatory Visit: Payer: Self-pay

## 2023-06-14 DIAGNOSIS — M542 Cervicalgia: Secondary | ICD-10-CM | POA: Insufficient documentation

## 2023-06-14 DIAGNOSIS — S161XXA Strain of muscle, fascia and tendon at neck level, initial encounter: Secondary | ICD-10-CM | POA: Diagnosis not present

## 2023-06-14 DIAGNOSIS — Z7982 Long term (current) use of aspirin: Secondary | ICD-10-CM | POA: Insufficient documentation

## 2023-06-14 DIAGNOSIS — M545 Low back pain, unspecified: Secondary | ICD-10-CM | POA: Insufficient documentation

## 2023-06-14 DIAGNOSIS — K7689 Other specified diseases of liver: Secondary | ICD-10-CM | POA: Insufficient documentation

## 2023-06-14 DIAGNOSIS — R519 Headache, unspecified: Secondary | ICD-10-CM | POA: Insufficient documentation

## 2023-06-14 DIAGNOSIS — Z8673 Personal history of transient ischemic attack (TIA), and cerebral infarction without residual deficits: Secondary | ICD-10-CM | POA: Insufficient documentation

## 2023-06-14 DIAGNOSIS — I700 Atherosclerosis of aorta: Secondary | ICD-10-CM | POA: Diagnosis not present

## 2023-06-14 DIAGNOSIS — Y92410 Unspecified street and highway as the place of occurrence of the external cause: Secondary | ICD-10-CM | POA: Insufficient documentation

## 2023-06-14 DIAGNOSIS — S199XXA Unspecified injury of neck, initial encounter: Secondary | ICD-10-CM | POA: Diagnosis present

## 2023-06-14 LAB — CBC
HCT: 40.3 % (ref 39.0–52.0)
Hemoglobin: 14.1 g/dL (ref 13.0–17.0)
MCH: 32 pg (ref 26.0–34.0)
MCHC: 35 g/dL (ref 30.0–36.0)
MCV: 91.6 fL (ref 80.0–100.0)
Platelets: 380 10*3/uL (ref 150–400)
RBC: 4.4 MIL/uL (ref 4.22–5.81)
RDW: 18.6 % — ABNORMAL HIGH (ref 11.5–15.5)
WBC: 9.2 10*3/uL (ref 4.0–10.5)
nRBC: 0 % (ref 0.0–0.2)

## 2023-06-14 LAB — BASIC METABOLIC PANEL
Anion gap: 11 (ref 5–15)
BUN: 10 mg/dL (ref 8–23)
CO2: 25 mmol/L (ref 22–32)
Calcium: 10.1 mg/dL (ref 8.9–10.3)
Chloride: 103 mmol/L (ref 98–111)
Creatinine, Ser: 0.9 mg/dL (ref 0.61–1.24)
GFR, Estimated: >60 mL/min (ref >60)
Glucose, Bld: 115 mg/dL — ABNORMAL HIGH (ref 70–99)
Potassium: 3.8 mmol/L (ref 3.5–5.1)
Sodium: 139 mmol/L (ref 135–145)

## 2023-06-14 MED ORDER — IBUPROFEN 400 MG PO TABS
400.0000 mg | ORAL_TABLET | Freq: Once | ORAL | Status: AC
  Administered 2023-06-14: 400 mg via ORAL
  Filled 2023-06-14: qty 1

## 2023-06-14 MED ORDER — LIDOCAINE 4 % EX PTCH: 1.0000 | MEDICATED_PATCH | CUTANEOUS | 0 refills | Status: AC

## 2023-06-14 MED ORDER — IOHEXOL 300 MG/ML  SOLN
100.0000 mL | Freq: Once | INTRAMUSCULAR | Status: AC | PRN
  Administered 2023-06-14: 100 mL via INTRAVENOUS

## 2023-06-14 MED ORDER — ALPRAZOLAM 0.5 MG PO TABS
0.5000 mg | ORAL_TABLET | Freq: Once | ORAL | Status: AC
  Administered 2023-06-14: 0.5 mg via ORAL
  Filled 2023-06-14: qty 1

## 2023-06-14 MED ORDER — METHOCARBAMOL 500 MG PO TABS: 500.0000 mg | ORAL_TABLET | Freq: Two times a day (BID) | ORAL | 0 refills | Status: AC

## 2023-06-14 MED ORDER — ACETAMINOPHEN 325 MG PO TABS
650.0000 mg | ORAL_TABLET | Freq: Once | ORAL | Status: AC
  Administered 2023-06-14: 650 mg via ORAL
  Filled 2023-06-14: qty 2

## 2023-06-14 MED ORDER — LIDOCAINE 5 % EX PTCH
2.0000 | MEDICATED_PATCH | Freq: Once | CUTANEOUS | Status: DC
  Administered 2023-06-14: 2 via TRANSDERMAL
  Filled 2023-06-14: qty 2
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
## 2023-06-14 NOTE — ED Notes (Signed)
Patient verbalizes understanding of discharge instructions. Opportunity for questioning and answers were provided. Armband removed by staff, pt discharged from ED. Ambulated out to lobby with family ? ?

## 2023-06-14 NOTE — Discharge Instructions (Signed)
Please follow-up with your primary doctor.  May take over-the-counter Tylenol alternate with Motrin for your pain.  We are prescribing you a muscle relaxer and lidocaine patches you may also use.  You really felt fevers, chills, sudden onset headache, unilateral weakness, numbness in your upper extremities, chest pain, shortness of breath, abdominal pain, nausea or vomiting or any new or worsening symptoms that are concerning to you.

## 2023-06-14 NOTE — ED Notes (Signed)
To CT via w/c.

## 2023-06-14 NOTE — ED Notes (Signed)
Pt alert, NAD, calm, interactive, MAEx4. EDP at [facility] for assessment. Lumbar TTP noted. Family at [facility]. [facility] present.
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
